# Patient Record
Sex: Female | Born: 1995 | Race: White | Hispanic: No | Marital: Single | Smoking: Never smoker
Health system: Southern US, Community
[De-identification: ages and names within clinical notes are randomized; demographics above are authoritative.]

## PROBLEM LIST (undated history)

## (undated) ENCOUNTER — Inpatient Hospital Stay: Payer: Self-pay

## (undated) DIAGNOSIS — Z62819 Personal history of unspecified abuse in childhood: Secondary | ICD-10-CM

## (undated) DIAGNOSIS — A6000 Herpesviral infection of urogenital system, unspecified: Secondary | ICD-10-CM

## (undated) DIAGNOSIS — Z789 Other specified health status: Secondary | ICD-10-CM

## (undated) DIAGNOSIS — O4693 Antepartum hemorrhage, unspecified, third trimester: Secondary | ICD-10-CM

## (undated) DIAGNOSIS — J189 Pneumonia, unspecified organism: Secondary | ICD-10-CM

## (undated) HISTORY — PX: FRACTURE SURGERY: SHX138

---
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
## 2014-02-25 ENCOUNTER — Emergency Department (INDEPENDENT_AMBULATORY_CARE_PROVIDER_SITE_OTHER)
Admission: EM | Admit: 2014-02-25 | Discharge: 2014-02-25 | Disposition: A | Discharge status: Home / Self Care | Payer: Medicaid Other | Source: Home / Self Care

## 2014-02-25 ENCOUNTER — Encounter (HOSPITAL_COMMUNITY): Payer: Self-pay | Admitting: Emergency Medicine

## 2014-02-25 ENCOUNTER — Other Ambulatory Visit (HOSPITAL_COMMUNITY)
Admission: RE | Admit: 2014-02-25 | Discharge: 2014-02-25 | Disposition: A | Discharge status: Home / Self Care | Payer: Self-pay | Source: Ambulatory Visit | Attending: Emergency Medicine | Admitting: Emergency Medicine

## 2014-02-25 DIAGNOSIS — N949 Unspecified condition associated with female genital organs and menstrual cycle: Secondary | ICD-10-CM

## 2014-02-25 DIAGNOSIS — N760 Acute vaginitis: Secondary | ICD-10-CM | POA: Insufficient documentation

## 2014-02-25 DIAGNOSIS — N730 Acute parametritis and pelvic cellulitis: Secondary | ICD-10-CM

## 2014-02-25 DIAGNOSIS — N898 Other specified noninflammatory disorders of vagina: Secondary | ICD-10-CM | POA: Diagnosis not present

## 2014-02-25 DIAGNOSIS — Z113 Encounter for screening for infections with a predominantly sexual mode of transmission: Secondary | ICD-10-CM | POA: Insufficient documentation

## 2014-02-25 DIAGNOSIS — N72 Inflammatory disease of cervix uteri: Secondary | ICD-10-CM

## 2014-02-25 DIAGNOSIS — R102 Pelvic and perineal pain: Secondary | ICD-10-CM

## 2014-02-25 LAB — POCT PREGNANCY, URINE: Preg Test, Ur: NEGATIVE

## 2014-02-25 LAB — POCT URINALYSIS DIP (DEVICE)
Bilirubin Urine: NEGATIVE
GLUCOSE, UA: NEGATIVE mg/dL
Hgb urine dipstick: NEGATIVE
KETONES UR: NEGATIVE mg/dL
Leukocytes, UA: NEGATIVE
Nitrite: NEGATIVE
PROTEIN: NEGATIVE mg/dL
SPECIFIC GRAVITY, URINE: 1.025 (ref 1.005–1.030)
Urobilinogen, UA: 0.2 mg/dL (ref 0.0–1.0)
pH: 7 (ref 5.0–8.0)

## 2014-02-25 LAB — CERVICOVAGINAL ANCILLARY ONLY
WET PREP (BD AFFIRM): NEGATIVE
WET PREP (BD AFFIRM): POSITIVE — AB
Wet Prep (BD Affirm): NEGATIVE

## 2014-02-25 MED ORDER — LIDOCAINE HCL (PF) 1 % IJ SOLN
INTRAMUSCULAR | Status: AC
  Filled 2014-02-25: qty 5

## 2014-02-25 MED ORDER — METRONIDAZOLE 500 MG PO TABS: 500.0000 mg | ORAL_TABLET | Freq: Two times a day (BID) | ORAL | Status: DC

## 2014-02-25 MED ORDER — CEFTRIAXONE SODIUM 250 MG IJ SOLR
INTRAMUSCULAR | Status: AC
  Filled 2014-02-25: qty 250

## 2014-02-25 MED ORDER — AZITHROMYCIN 250 MG PO TABS
1000.0000 mg | ORAL_TABLET | Freq: Every day | ORAL | Status: DC
  Administered 2014-02-25: 1000 mg via ORAL

## 2014-02-25 MED ORDER — CEFTRIAXONE SODIUM 250 MG IJ SOLR
250.0000 mg | Freq: Once | INTRAMUSCULAR | Status: AC
  Administered 2014-02-25: 250 mg via INTRAMUSCULAR

## 2014-02-25 MED ORDER — AZITHROMYCIN 250 MG PO TABS
ORAL_TABLET | ORAL | Status: AC
  Filled 2014-02-25: qty 1

## 2014-02-25 NOTE — Discharge Instructions (Signed)
Read all instructions Pelvic Inflammatory Disease Pelvic inflammatory disease (PID) refers to an infection in some or all of the female organs. The infection can be in the uterus, ovaries, fallopian tubes, or the surrounding tissues in the pelvis. PID can cause abdominal or pelvic pain that comes on suddenly (acute pelvic pain). PID is a serious infection because it can lead to lasting (chronic) pelvic pain or the inability to have children (infertile).  CAUSES  The infection is often caused by the normal bacteria found in the vaginal tissues. PID may also be caused by an infection that is spread during sexual contact. PID can also occur following:   The birth of a baby.   A miscarriage.   An abortion.   Major pelvic surgery.   The use of an intrauterine device (IUD).   A sexual assault.  RISK FACTORS Certain factors can put a person at higher risk for PID, such as:  Being younger than 25 years.  Being sexually active at [place] age.  Usingnonbarrier contraception.  Havingmultiple sexual partners.  Having sex with someone who has symptoms of a genital infection.  Using oral contraception. Other times, certain behaviors can increase the possibility of getting PID, such as:  Having sex during your period.  Using a vaginal douche.  Having an intrauterine device (IUD) in place. SYMPTOMS   Abdominal or pelvic pain.   Fever.   Chills.   Abnormal vaginal discharge.  Abnormal uterine bleeding.   Unusual pain shortly after finishing your period. DIAGNOSIS  Your caregiver will choose some of the following methods to make a diagnosis, such as:   Performinga physical exam and history. A pelvic exam typically reveals a very tender uterus and surrounding pelvis.   Ordering laboratory tests including a pregnancy test, blood tests, and urine test.  Orderingcultures of the vagina and cervix to check for a sexually transmitted infection (STI).  Performing an  ultrasound.   Performing a laparoscopic procedure to look inside the pelvis.  TREATMENT   Antibiotic medicines may be prescribed and taken by mouth.   Sexual partners may be treated when the infection is caused by a sexually transmitted disease (STD).   Hospitalization may be needed to give antibiotics intravenously.  Surgery may be needed, but this is rare. It may take weeks until you are completely well. If you are diagnosed with PID, you should also be checked for human immunodeficiency virus (HIV). HOME CARE INSTRUCTIONS   If given, take your antibiotics as directed. Finish the medicine even if you start to feel better.   Only take over-the-counter or prescription medicines for pain, discomfort, or fever as directed by your caregiver.   Do not have sexual intercourse until treatment is completed or as directed by your caregiver. If PID is confirmed, your recent sexual partner(s) will need treatment.   Keep your follow-up appointments. SEEK MEDICAL CARE IF:   You have increased or abnormal vaginal discharge.   You need prescription medicine for your pain.   You vomit.   You cannot take your medicines.   Your partner has an STD.  SEEK IMMEDIATE MEDICAL CARE IF:   You have a fever.   You have increased abdominal or pelvic pain.   You have chills.   You have pain when you urinate.   You are not better after 72 hours following treatment.  MAKE SURE YOU:   Understand these instructions.  Will watch your condition.  Will get help right away if you are not doing well  or get worse. Document Released: 11/04/2005 Document Revised: 03/01/2013 Document Reviewed: 10/31/2011 [facility] Patient Information 2014 [place], [place].  Cervicitis Cervicitis is a soreness and swelling (inflammation) of the cervix. Your cervix is located at the bottom of your uterus. It opens up to the vagina. CAUSES   Sexually transmitted infections (STIs).   Allergic  reaction.   Medicines or birth control devices that are put in the vagina.   Injury to the cervix.   Bacterial infections.  RISK FACTORS You are at greater risk if you:  Have unprotected sexual intercourse.  Have sexual intercourse with many partners.  Began sexual intercourse at an early age.  Have a history of STIs. SYMPTOMS  There may be no symptoms. If symptoms occur, they may include:   Grey, white, yellow, or bad-smelling vaginal discharge.   Pain or itching of the area outside the vagina.   Painful sexual intercourse.   Lower abdominal or lower back pain, especially during intercourse.   Frequent urination.   Abnormal vaginal bleeding between periods, after sexual intercourse, or after menopause.   Pressure or a heavy feeling in the pelvis.  DIAGNOSIS  Diagnosis is made after a pelvic exam. Other tests may include:   Examination of any discharge under a microscope (wet prep).   A Pap test.  TREATMENT  Treatment will depend on the cause of cervicitis. If it is caused by an STI, both you and your partner will need to be treated. Antibiotic medicines will be given.  HOME CARE INSTRUCTIONS   Do not have sexual intercourse until your health care provider says it is okay.   Do not have sexual intercourse until your partner has been treated, if your cervicitis is caused by an STI.   Take your antibiotics as directed. Finish them even if you start to feel better.  SEEK MEDICAL CARE IF:  Your symptoms come back.   You have a fever.  MAKE SURE YOU:   Understand these instructions.  Will watch your condition.  Will get help right away if you are not doing well or get worse. Document Released: 11/04/2005 Document Revised: 07/07/2013 Document Reviewed: 04/28/2013 [facility] Patient Information 2014 [place].  Sexually Transmitted Disease A sexually transmitted disease (STD) is a disease or infection that may be passed (transmitted) from  person to person, usually during sexual activity. This may happen by way of saliva, semen, blood, vaginal mucus, or urine. Common STDs include:   Gonorrhea.   Chlamydia.   Syphilis.   HIV and AIDS.   Genital herpes.   Hepatitis B and C.   Trichomonas.   Human papillomavirus (HPV).   Pubic lice.   Scabies.  Mites.  Bacterial vaginosis. WHAT ARE CAUSES OF STDs? An STD may be caused by bacteria, a virus, or parasites. STDs are often transmitted during sexual activity if one person is infected. However, they may also be transmitted through nonsexual means. STDs may be transmitted after:   Sexual intercourse with an infected person.   Sharing sex toys with an infected person.   Sharing needles with an infected person or using unclean piercing or tattoo needles.  Having intimate contact with the genitals, mouth, or rectal areas of an infected person.   Exposure to infected fluids during birth. WHAT ARE THE SIGNS AND SYMPTOMS OF STDs? Different STDs have different symptoms. Some people may not have any symptoms. If symptoms are present, they may include:   Painful or bloody urination.   Pain in the pelvis, abdomen, vagina, anus,  throat, or eyes.   Skin rash, itching, irritation, growths, sores (lesions), ulcerations, or warts in the genital or anal area.  Abnormal vaginal discharge with or without bad odor.   Penile discharge in men.   Fever.   Pain or bleeding during sexual intercourse.   Swollen glands in the groin area.   Yellow skin and eyes (jaundice). This is seen with hepatitis.   Swollen testicles.  Infertility.  Sores and blisters in the mouth. HOW ARE STDs DIAGNOSED? To make a diagnosis, your health care provider may:   Take a medical history.   Perform a physical exam.   Take a sample of any discharge for examination.  Swab the throat, cervix, opening to the penis, rectum, or vagina for testing.  Test a sample of your  first morning urine.   Perform blood tests.   Perform a Pap smear, if this applies.   Perform a colposcopy.   Perform a laparoscopy.  HOW ARE STDs TREATED? Treatment depends on the STD. Some STDs may be treated but not cured.   Chlamydia, gonorrhea, trichomonas, and syphilis can be cured with antibiotics.   Genital herpes, hepatitis, and HIV can be treated, but not cured, with prescribed medicines. The medicines lessen symptoms.   Genital warts from HPV can be treated with medicine or by freezing, burning (electrocautery), or surgery. Warts may come back.   HPV cannot be cured with medicine or surgery. However, abnormal areas may be removed from the cervix, vagina, or vulva.   If your diagnosis is confirmed, your recent sexual partners need treatment. This is true even if they are symptom-free or have a negative culture or evaluation. They should not have sex until their health care providers say it is OK. HOW CAN I REDUCE MY RISK OF GETTING AN STD?  Use latex condoms, dental dams, and water-soluble lubricants during sexual activity. Do not use petroleum jelly or oils.  Get vaccinated for HPV and hepatitis. If you have not received these vaccines in the past, talk to your health care provider about whether one or both might be right for you.   Avoid risky sex practices that can break the skin.  WHAT SHOULD I DO IF I THINK I HAVE AN STD?  See your health care provider.   Inform all sexual partners. They should be tested and treated for any STDs.  Do not have sex until your health care provider says it is OK. WHEN SHOULD I GET HELP? Seek immediate medical care if:  You develop severe abdominal pain.  You are a man and notice swelling or pain in the testicles.  You are a woman and notice swelling or pain in your vagina. Document Released: 01/25/2003 Document Revised: 08/25/2013 Document Reviewed: 05/25/2013 [facility] Patient Information 2014 [place], [place].

## 2014-02-25 NOTE — ED Notes (Signed)
Reports generalized abdominal pain, burning with urination.  Reports vaginal discharge, but says it is normal for her.

## 2014-02-25 NOTE — ED Provider Notes (Signed)
CSN: [id number]     Arrival date & time 02/25/14  1023 History   First MD Initiated Contact with Patient 02/25/14 1220     Chief Complaint  Patient presents with  . Abdominal Pain   (Consider location/radiation/quality/duration/timing/severity/associated sxs/prior Treatment) HPI Comments: 18 year old female complaining of "stomach pain" for 2 days. Pain is located bilaterally primarily in the left and right pelvis as well as across the midabdomen. Pain is worse with sitting. She also has a vaginal discharge but states this is chronic and not abnormal for her. Complains of dysuria for 2 days. Denies constitutional symptoms.   History reviewed. No pertinent past medical history. History reviewed. No pertinent past surgical history. No family history on file. History  Substance Use Topics  . Smoking status: Never Smoker   . Smokeless tobacco: Not on file  . Alcohol Use: No   OB History   Grav Para Term Preterm Abortions TAB SAB Ect Mult Living                 Review of Systems  Constitutional: Negative.   HENT: Negative.   Respiratory: Negative.   Cardiovascular: Negative.   Gastrointestinal: Positive for nausea and abdominal pain. Negative for vomiting and abdominal distention.  Genitourinary: Positive for dysuria, vaginal discharge and pelvic pain. Negative for urgency, frequency, flank pain and vaginal bleeding.  Neurological: Negative for seizures and headaches.    Allergies  Review of patient's allergies indicates no known allergies.  Home Medications   Current Outpatient Rx  Name  Route  Sig  Dispense  Refill  . metroNIDAZOLE (FLAGYL) 500 MG tablet   Oral   Take 1 tablet (500 mg total) by mouth 2 (two) times daily. X 7 days   14 tablet   0    BP 107/68  Pulse 66  Temp(Src) 98.1 F (36.7 C) (Oral)  Resp 18  SpO2 100%  LMP 01/19/2014 Physical Exam  Nursing note and vitals reviewed. Constitutional: She is oriented to person, place, and time. She appears  well-developed and well-nourished. No distress.  Eyes: Conjunctivae and EOM are normal.  Neck: Normal range of motion. Neck supple.  Cardiovascular: Normal rate, regular rhythm and normal heart sounds.   No murmur heard. Pulmonary/Chest: Effort normal and breath sounds normal. No respiratory distress. She has no wheezes.  Abdominal: Soft. Bowel sounds are normal. She exhibits no mass. There is tenderness. There is guarding. There is no rebound.  Greatest tenderness in the bilateral pelvis, Lesser to mid abdomen.  Genitourinary: Vaginal discharge found.  NEFG Moderate amt White creamy vag D/C. Cx midline far posterior. Ectocx erythematous and tender. No FB seen. +CMT and Bilat adnexal tenderness..moderate to severe.  Musculoskeletal: She exhibits no edema and no tenderness.  Neurological: She is alert and oriented to person, place, and time.  Skin: Skin is warm and dry.  Psychiatric: She has a normal mood and affect.    ED Course  Procedures (including critical care time) Labs Review Labs Reviewed  POCT URINALYSIS DIP (DEVICE)  POCT PREGNANCY, URINE  CERVICOVAGINAL ANCILLARY ONLY   Imaging Review No results found. Results for orders placed during the hospital encounter of 02/25/14  POCT URINALYSIS DIP (DEVICE)      Result Value Ref Range   Glucose, UA NEGATIVE  NEGATIVE mg/dL   Bilirubin Urine NEGATIVE  NEGATIVE   Ketones, ur NEGATIVE  NEGATIVE mg/dL   Specific Gravity, Urine 1.025  1.005 - 1.030   Hgb urine dipstick NEGATIVE  NEGATIVE   pH 7.0  5.0 - 8.0   Protein, ur NEGATIVE  NEGATIVE mg/dL   Urobilinogen, UA 0.2  0.0 - 1.0 mg/dL   Nitrite NEGATIVE  NEGATIVE   Leukocytes, UA NEGATIVE  NEGATIVE  POCT PREGNANCY, URINE      Result Value Ref Range   Preg Test, Ur NEGATIVE  NEGATIVE     MDM   1. PID (acute pelvic inflammatory disease)   2. Cervicitis   3. Vaginal discharge   4. Pelvic pain    cerv cytology pending Rocephin 250 mg IM Azithromycin 1 gm po Rx flagyl  500 bid       Janne Napoleon, NP 02/25/14 1256

## 2014-02-25 NOTE — ED Provider Notes (Signed)
Medical screening examination/treatment/procedure(s) were performed by resident physician or non-physician practitioner and as supervising physician I was immediately available for consultation/collaboration.   Breyah Akhter DOUGLAS MD.   Bejamin Hackbart D Emalynn Clewis, MD 02/25/14 1258 

## 2014-02-28 LAB — CERVICOVAGINAL ANCILLARY ONLY
CHLAMYDIA, DNA PROBE: NEGATIVE
Neisseria Gonorrhea: NEGATIVE

## 2014-03-02 ENCOUNTER — Telehealth (HOSPITAL_COMMUNITY): Payer: Self-pay | Admitting: *Deleted

## 2014-03-02 MED ORDER — FLUCONAZOLE 150 MG PO TABS: ORAL_TABLET | ORAL | Status: DC

## 2014-03-02 NOTE — ED Notes (Signed)
GC/Chlamydia neg., Affirm: Candida pos., Gardnerella and Trich neg.  Message sent to Janne Napoleon NP.  He e-prescribed Diflucan to pt.'s pharmacy [place] and also printed.  I called pt. and left a message to call.  Call 1. Hanley [facility] 03/02/2014

## 2014-04-08 ENCOUNTER — Emergency Department (HOSPITAL_COMMUNITY)
Admission: EM | Admit: 2014-04-08 | Discharge: 2014-04-08 | Disposition: A | Discharge status: Home / Self Care | Payer: Self-pay | Attending: Emergency Medicine | Admitting: Emergency Medicine

## 2014-04-08 ENCOUNTER — Emergency Department (HOSPITAL_COMMUNITY): Payer: Self-pay

## 2014-04-08 ENCOUNTER — Encounter (HOSPITAL_COMMUNITY): Payer: Self-pay | Admitting: Emergency Medicine

## 2014-04-08 DIAGNOSIS — Z79899 Other long term (current) drug therapy: Secondary | ICD-10-CM | POA: Insufficient documentation

## 2014-04-08 DIAGNOSIS — Y929 Unspecified place or not applicable: Secondary | ICD-10-CM | POA: Insufficient documentation

## 2014-04-08 DIAGNOSIS — Y9389 Activity, other specified: Secondary | ICD-10-CM | POA: Insufficient documentation

## 2014-04-08 DIAGNOSIS — S93409A Sprain of unspecified ligament of unspecified ankle, initial encounter: Secondary | ICD-10-CM | POA: Insufficient documentation

## 2014-04-08 DIAGNOSIS — IMO0002 Reserved for concepts with insufficient information to code with codable children: Secondary | ICD-10-CM | POA: Insufficient documentation

## 2014-04-08 MED ORDER — HYDROCODONE-ACETAMINOPHEN 5-325 MG PO TABS
1.0000 | ORAL_TABLET | Freq: Once | ORAL | Status: AC
  Administered 2014-04-08: 1 via ORAL
  Filled 2014-04-08: qty 1

## 2014-04-08 MED ORDER — IBUPROFEN 800 MG PO TABS: 800.0000 mg | ORAL_TABLET | Freq: Three times a day (TID) | ORAL | Status: DC

## 2014-04-08 NOTE — ED Notes (Signed)
Patient transported to X-ray 

## 2014-04-08 NOTE — ED Notes (Signed)
Pt refused ice pack at this time. States "it hurts too much."

## 2014-04-08 NOTE — ED Provider Notes (Signed)
Medical screening examination/treatment/procedure(s) were performed by non-physician practitioner and as supervising physician I was immediately available for consultation/collaboration.   Leota Jacobsen, MD 04/08/14 2234

## 2014-04-08 NOTE — ED Provider Notes (Signed)
CSN: [id number]     Arrival date & time 04/08/14  2031 History  This chart was scribed for non-physician practitioner, Hazel Sams, PA-C working with Leota Jacobsen, MD by Einar Pheasant, ED scribe. This patient was seen in room WTR8/WTR8 and the patient's care was started at 8:42 PM.    Chief Complaint  Patient presents with  . Ankle Injury   The history is provided by the patient. No language interpreter was used.   HPI Comments: Jeanette Wright is a 18 y.o. female who presents to the Emergency Department complaining of worsening right ankle pain that started approximately 1 hour ago. Pt states that she was playing around with her friend when she accidenttaly kicked him in the shin. She immediately started to experience "throbbing" pain. Her right foot is currently wrapped with an ACE wrap. She denies any fever, chills, nausea, emesis, or SOB.   No past medical history on file. No past surgical history on file. No family history on file. History  Substance Use Topics  . Smoking status: Never Smoker   . Smokeless tobacco: Not on file  . Alcohol Use: No   OB History   Grav Para Term Preterm Abortions TAB SAB Ect Mult Living                 Review of Systems  Constitutional: Negative for fever and chills.  HENT: Negative for congestion, rhinorrhea and sore throat.   Respiratory: Negative for cough and shortness of breath.   Cardiovascular: Negative for chest pain.  Gastrointestinal: Negative for nausea, vomiting, abdominal pain, diarrhea and constipation.  Musculoskeletal: Positive for arthralgias (R ankle).  Skin: Negative for rash and wound.  Neurological: Negative for syncope, numbness and headaches.  All other systems reviewed and are negative.  Allergies  Review of patient's allergies indicates no known allergies.  Home Medications   Prior to Admission medications   Medication Sig Start Date End Date Taking? Authorizing Provider  fluconazole (DIFLUCAN) 150 MG  tablet 1 tab po x 1. May repeat in 72 hours if no improvement 03/02/14   Janne Napoleon, NP  metroNIDAZOLE (FLAGYL) 500 MG tablet Take 1 tablet (500 mg total) by mouth 2 (two) times daily. X 7 days 02/25/14   Janne Napoleon, NP   Triage Vitals: BP 112/50  Pulse 59  Temp(Src) 98.8 F (37.1 C) (Oral)  Resp 16  SpO2 100%  LMP 04/08/2014  Physical Exam  Nursing note and vitals reviewed. Constitutional: She appears well-developed and well-nourished. No distress.  Temp: 98.7  BP:101/62 HR:83  HENT:  Head: Normocephalic and atraumatic.  Eyes: Conjunctivae are normal. Right eye exhibits no discharge. Left eye exhibits no discharge.  Neck: Neck supple.  Cardiovascular: Normal rate, regular rhythm and normal heart sounds.  Exam reveals no gallop and no friction rub.   No murmur heard. Good dorsal pedal pulse. Good capillary refill.  Pulmonary/Chest: Effort normal and breath sounds normal. No respiratory distress.  Abdominal: Soft. She exhibits no distension. There is no tenderness.  Musculoskeletal: She exhibits tenderness. She exhibits no edema.  There is tenderness around the right ankle with reduced range of motion secondary to pain. There is prominence of the talus bone but this is symmetrical bilaterally. Normal dorsal pedal pulses. No deformity of the lateral or medial malleolus. Normal distal sensation to the toes and capillary refill.  Neurological: She is alert.  Skin: Skin is warm and dry.  No discoloration noted.   Psychiatric: She has a normal mood and affect.  Her behavior is normal. Thought content normal.    ED Course  Procedures   DIAGNOSTIC STUDIES: Oxygen Saturation is 100% on RA, normal by my interpretation.    COORDINATION OF CARE: 8:49 PM- Pt advised of plan for treatment and pt agrees.  X-rays reviewed. No signs of fracture or dislocation. Suspect mild sprain. Patient instructed to use rest, ice, compression elevation.    Imaging Review Dg Ankle Complete  Right  04/08/2014   CLINICAL DATA:  Injury.  Pain.  EXAM: RIGHT ANKLE - COMPLETE 3+ VIEW  COMPARISON:  None.  FINDINGS: There is no evidence of fracture, dislocation, or joint effusion. There is no evidence of arthropathy or other focal bone abnormality. Soft tissues are unremarkable.  IMPRESSION: Negative.   Electronically Signed   By: Lajean Manes M.D.   On: 04/08/2014 21:12    MDM   Final diagnoses:  Ankle sprain    I personally performed the services described in this documentation, which was scribed in my presence. The recorded information has been reviewed and is accurate.   Martie Lee, PA-C 04/08/14 2131

## 2014-04-08 NOTE — ED Notes (Signed)
Pt states she kicked her friend in the shin and injured her R ankle. Pt has swelling and bruising to R ankle. Pt states she has been unable to bear wt on ankle. Pt arrives to exam room in wheelchair. Family with pt.

## 2014-04-08 NOTE — Discharge Instructions (Signed)
Your x-rays of your ankle today did not show any broken bones or dislocation. At this time your providers feel you have sprained her ankle. Use rest, ice, compression and elevation to reduce pain and swelling. Followup with your primary doctor for continued evaluation and treatment.    Ankle Sprain An ankle sprain is an injury to the strong, fibrous tissues (ligaments) that hold the bones of your ankle joint together.  CAUSES An ankle sprain is usually caused by a fall or by twisting your ankle. Ankle sprains most commonly occur when you step on the outer edge of your foot, and your ankle turns inward. People who participate in sports are more prone to these types of injuries.  SYMPTOMS   Pain in your ankle. The pain may be present at rest or only when you are trying to stand or walk.  Swelling.  Bruising. Bruising may develop immediately or within 1 to 2 days after your injury.  Difficulty standing or walking, particularly when turning corners or changing directions. DIAGNOSIS  Your caregiver will ask you details about your injury and perform a physical exam of your ankle to determine if you have an ankle sprain. During the physical exam, your caregiver will press on and apply pressure to specific areas of your foot and ankle. Your caregiver will try to move your ankle in certain ways. An X-ray exam may be done to be sure a bone was not broken or a ligament did not separate from one of the bones in your ankle (avulsion fracture).  TREATMENT  Certain types of braces can help stabilize your ankle. Your caregiver can make a recommendation for this. Your caregiver may recommend the use of medicine for pain. If your sprain is severe, your caregiver may refer you to a surgeon who helps to restore function to parts of your skeletal system (orthopedist) or a physical therapist. [place] ice to your injury for 1 2 days or as directed by your caregiver. Applying ice helps to  reduce inflammation and pain.  Put ice in a plastic bag.  Place a towel between your skin and the bag.  Leave the ice on for 15-20 minutes at a time, every 2 hours while you are awake.  Only take over-the-counter or prescription medicines for pain, discomfort, or fever as directed by your caregiver.  Elevate your injured ankle above the level of your heart as much as possible for 2 3 days.  If your caregiver recommends crutches, use them as instructed. Gradually put weight on the affected ankle. Continue to use crutches or a cane until you can walk without feeling pain in your ankle.  If you have a plaster splint, wear the splint as directed by your caregiver. Do not rest it on anything harder than a pillow for the first 24 hours. Do not put weight on it. Do not get it wet. You may take it off to take a shower or bath.  You may have been given an elastic bandage to wear around your ankle to provide support. If the elastic bandage is too tight (you have numbness or tingling in your foot or your foot becomes cold and blue), adjust the bandage to make it comfortable.  If you have an air splint, you may blow more air into it or let air out to make it more comfortable. You may take your splint off at night and before taking a shower or bath. Wiggle your toes in the splint several times  per day to decrease swelling. SEEK MEDICAL CARE IF:   You have rapidly increasing bruising or swelling.  Your toes feel extremely cold or you lose feeling in your foot.  Your pain is not relieved with medicine. SEEK IMMEDIATE MEDICAL CARE IF:  Your toes are numb or blue.  You have severe pain that is increasing. MAKE SURE YOU:   Understand these instructions.  Will watch your condition.  Will get help right away if you are not doing well or get worse. Document Released: 11/04/2005 Document Revised: 07/29/2012 Document Reviewed: 11/16/2011 [facility] Patient Information 2014 [place], [place].

## 2014-04-08 NOTE — ED Notes (Signed)
Pt has a ride home.  

## 2014-06-30 LAB — OB RESULTS CONSOLE HEPATITIS B SURFACE ANTIGEN: Hepatitis B Surface Ag: NEGATIVE

## 2014-06-30 LAB — OB RESULTS CONSOLE RUBELLA ANTIBODY, IGM: Rubella: IMMUNE

## 2014-06-30 LAB — OB RESULTS CONSOLE HIV ANTIBODY (ROUTINE TESTING): HIV: NONREACTIVE

## 2014-06-30 LAB — OB RESULTS CONSOLE GC/CHLAMYDIA
CHLAMYDIA, DNA PROBE: NEGATIVE
Gonorrhea: NEGATIVE

## 2014-06-30 LAB — OB RESULTS CONSOLE RPR: RPR: NONREACTIVE

## 2014-10-27 ENCOUNTER — Observation Stay: Payer: Self-pay | Admitting: Obstetrics and Gynecology

## 2014-11-18 NOTE — L&D Delivery Note (Addendum)
Delivery Note At 5:54 PM a viable and healthy female was delivered via  (Presentation: OA; ROT).  APGAR: 9,9 ; weight  P.   Placenta status: delivered, intact.  Cord: 3VC with the following complications: none .    Anesthesia:  epidural Episiotomy:  none Lacerations:  Hemostatic B periurethral abrasions Suture Repair: N/A Est. Blood Loss (mL): 400cc   Mom to postpartum.  Baby to Couplet care / Skin to Skin.  Bovard-Stuckert, Jeanette Wright 01/11/2015, 6:13 PM  Bo/A+/Contra Mirena/RI/Tdap in [facility]  D/W pt and FOB circumcision for female infant, including r/b/a wish to proceed.     [name]

## 2014-12-16 LAB — OB RESULTS CONSOLE GBS: STREP GROUP B AG: NEGATIVE

## 2015-01-02 ENCOUNTER — Observation Stay (HOSPITAL_COMMUNITY)
Admission: EM | Admit: 2015-01-02 | Discharge: 2015-01-03 | Disposition: A | Discharge status: Home / Self Care | Payer: Medicaid Other | Attending: Obstetrics and Gynecology | Admitting: Obstetrics and Gynecology

## 2015-01-02 ENCOUNTER — Encounter (HOSPITAL_COMMUNITY): Payer: Self-pay | Admitting: *Deleted

## 2015-01-02 DIAGNOSIS — S0990XA Unspecified injury of head, initial encounter: Principal | ICD-10-CM | POA: Insufficient documentation

## 2015-01-02 DIAGNOSIS — Y92410 Unspecified street and highway as the place of occurrence of the external cause: Secondary | ICD-10-CM | POA: Insufficient documentation

## 2015-01-02 DIAGNOSIS — Z3A38 38 weeks gestation of pregnancy: Secondary | ICD-10-CM | POA: Diagnosis not present

## 2015-01-02 DIAGNOSIS — Y939 Activity, unspecified: Secondary | ICD-10-CM | POA: Insufficient documentation

## 2015-01-02 DIAGNOSIS — Y999 Unspecified external cause status: Secondary | ICD-10-CM | POA: Insufficient documentation

## 2015-01-02 DIAGNOSIS — O9A213 Injury, poisoning and certain other consequences of external causes complicating pregnancy, third trimester: Secondary | ICD-10-CM

## 2015-01-02 HISTORY — DX: Pneumonia, unspecified organism: J18.9

## 2015-01-02 HISTORY — DX: Other specified health status: Z78.9

## 2015-01-02 HISTORY — DX: Injury, poisoning and certain other consequences of external causes complicating pregnancy, third trimester: O9A.213

## 2015-01-02 LAB — CBC
HCT: 29 % — ABNORMAL LOW (ref 36.0–46.0)
Hemoglobin: 9.2 g/dL — ABNORMAL LOW (ref 12.0–15.0)
MCH: 26.8 pg (ref 26.0–34.0)
MCHC: 31.7 g/dL (ref 30.0–36.0)
MCV: 84.5 fL (ref 78.0–100.0)
Platelets: 203 10*3/uL (ref 150–400)
RBC: 3.43 MIL/uL — AB (ref 3.87–5.11)
RDW: 13.5 % (ref 11.5–15.5)
WBC: 14.8 10*3/uL — ABNORMAL HIGH (ref 4.0–10.5)

## 2015-01-02 LAB — TYPE AND SCREEN
ABO/RH(D): A POS
ANTIBODY SCREEN: NEGATIVE

## 2015-01-02 MED ORDER — ACETAMINOPHEN 325 MG PO TABS
650.0000 mg | ORAL_TABLET | Freq: Once | ORAL | Status: AC
  Administered 2015-01-02: 650 mg via ORAL
  Filled 2015-01-02: qty 2

## 2015-01-02 MED ORDER — ZOLPIDEM TARTRATE 5 MG PO TABS: 5.0000 mg | ORAL_TABLET | Freq: Every evening | ORAL | Status: DC | PRN

## 2015-01-02 MED ORDER — DOCUSATE SODIUM 100 MG PO CAPS
100.0000 mg | ORAL_CAPSULE | Freq: Every day | ORAL | Status: DC
Start: 2015-01-02 — End: 2015-01-03
  Filled 2015-01-02: qty 1

## 2015-01-02 MED ORDER — ACETAMINOPHEN 325 MG PO TABS
650.0000 mg | ORAL_TABLET | ORAL | Status: DC | PRN
  Administered 2015-01-03 (×2): 650 mg via ORAL
  Filled 2015-01-02 (×2): qty 2

## 2015-01-02 MED ORDER — BUTORPHANOL TARTRATE 1 MG/ML IJ SOLN
INTRAMUSCULAR | Status: AC
  Administered 2015-01-02: 1 mg via INTRAVENOUS
  Filled 2015-01-02: qty 1

## 2015-01-02 MED ORDER — CALCIUM CARBONATE ANTACID 500 MG PO CHEW: 2.0000 | CHEWABLE_TABLET | ORAL | Status: DC | PRN

## 2015-01-02 MED ORDER — PRENATAL MULTIVITAMIN CH
1.0000 | ORAL_TABLET | Freq: Every day | ORAL | Status: DC
  Administered 2015-01-03: 1 via ORAL
  Filled 2015-01-02: qty 1

## 2015-01-02 MED ORDER — BUTORPHANOL TARTRATE 1 MG/ML IJ SOLN
1.0000 mg | Freq: Once | INTRAMUSCULAR | Status: AC
  Administered 2015-01-02: 1 mg via INTRAVENOUS

## 2015-01-02 NOTE — ED Notes (Addendum)
[facility] reports pt is contracting, will be transferred to [facility] for obs. Going to antenatal #152. Dr Luanna Cole is accepting MD.
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
## 2015-01-02 NOTE — Progress Notes (Signed)
Patient removed from monitor for transfer to room 160 for staffing needs.  However, notified to leave patient in 155 for now unless laboring.

## 2015-01-02 NOTE — Progress Notes (Signed)
This note also relates to the following rows which could not be included: Pulse Rate - Cannot attach notes to unvalidated device data Resp - Cannot attach notes to unvalidated device data SpO2 - Cannot attach notes to unvalidated device data   EFM off for transfer to antenatal via [facility].

## 2015-01-03 LAB — ABO/RH: ABO/RH(D): A POS

## 2015-01-03 MED ORDER — OXYCODONE-ACETAMINOPHEN 5-325 MG PO TABS
1.0000 | ORAL_TABLET | Freq: Once | ORAL | Status: DC
  Filled 2015-01-03: qty 1
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
## 2015-01-03 NOTE — Progress Notes (Signed)
Discharge instructions reviewed with patient. Patient verbalizes understanding and all questions answered. Belongings gathered and with patient and significant other.

## 2015-01-03 NOTE — Progress Notes (Signed)
Marvel Plan, MD, notified of patients complaint of persistent headache and nausea a with little relief from tylenol. Pt wondering if she has a concussion and wanted to know if she needed any kind of scan. MD updated on assessment of patient. Orders received to give 1 perocet at this time.
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
## 2015-01-03 NOTE — Progress Notes (Signed)
Patient ID: Jeanette Wright, female   DOB: 1995/11/29, 19 y.o.   MRN: [id number] Pt doing well.  Had HA earlier but improved throughout day.  Was worried re: possible mild concussion, no focal neuro symptoms Good FM, not feeling any contractions.  Scant spotting FHR category one and no decels She feels ready for d/c home, declines pain medications Will d/c home and f/u in office on Friday 01/06/15 D/w pt kick counts

## 2015-01-04 ENCOUNTER — Inpatient Hospital Stay (HOSPITAL_COMMUNITY)
Admission: AD | Admit: 2015-01-04 | Discharge: 2015-01-04 | Disposition: A | Discharge status: Home / Self Care | Payer: Medicaid Other | Source: Ambulatory Visit | Attending: Obstetrics and Gynecology | Admitting: Obstetrics and Gynecology

## 2015-01-04 ENCOUNTER — Encounter (HOSPITAL_COMMUNITY): Payer: Self-pay

## 2015-01-04 DIAGNOSIS — O471 False labor at or after 37 completed weeks of gestation: Secondary | ICD-10-CM | POA: Insufficient documentation

## 2015-01-04 DIAGNOSIS — O26853 Spotting complicating pregnancy, third trimester: Secondary | ICD-10-CM | POA: Diagnosis not present

## 2015-01-04 DIAGNOSIS — Z3A39 39 weeks gestation of pregnancy: Secondary | ICD-10-CM | POA: Diagnosis not present

## 2015-01-04 LAB — URINALYSIS, ROUTINE W REFLEX MICROSCOPIC
Bilirubin Urine: NEGATIVE
Glucose, UA: NEGATIVE mg/dL
Ketones, ur: NEGATIVE mg/dL
Leukocytes, UA: NEGATIVE
Nitrite: NEGATIVE
Protein, ur: NEGATIVE mg/dL
Urobilinogen, UA: 0.2 mg/dL (ref 0.0–1.0)
pH: 6 (ref 5.0–8.0)

## 2015-01-04 LAB — URINE MICROSCOPIC-ADD ON
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
## 2015-01-04 NOTE — Discharge Instructions (Signed)
Braxton Hicks Contractions °Contractions of the uterus can occur throughout pregnancy. Contractions are not always a sign that you are in labor.  °WHAT ARE BRAXTON HICKS CONTRACTIONS?  °Contractions that occur before labor are called Braxton Hicks contractions, or false labor. Toward the end of pregnancy (32-34 weeks), these contractions can develop more often and may become more forceful. This is not true labor because these contractions do not result in opening (dilatation) and thinning of the cervix. They are sometimes difficult to tell apart from true labor because these contractions can be forceful and people have different pain tolerances. You should not feel embarrassed if you go to the hospital with false labor. Sometimes, the only way to tell if you are in true labor is for your health care provider to look for changes in the cervix. °If there are no prenatal problems or other health problems associated with the pregnancy, it is completely safe to be sent home with false labor and await the onset of true labor. °HOW CAN YOU TELL THE DIFFERENCE BETWEEN TRUE AND FALSE LABOR? °False Labor °· The contractions of false labor are usually shorter and not as hard as those of true labor.   °· The contractions are usually irregular.   °· The contractions are often felt in the front of the lower abdomen and in the groin.   °· The contractions may go away when you walk around or change positions while lying down.   °· The contractions get weaker and are shorter lasting as time goes on.   °· The contractions do not usually become progressively stronger, regular, and closer together as with true labor.   °True Labor °· Contractions in true labor last 30-70 seconds, become very regular, usually become more intense, and increase in frequency.   °· The contractions do not go away with walking.   °· The discomfort is usually felt in the top of the uterus and spreads to the lower abdomen and low back.   °· True labor can be  determined by your health care provider with an exam. This will show that the cervix is dilating and getting thinner.   °WHAT TO REMEMBER °· Keep up with your usual exercises and follow other instructions given by your health care provider.   °· Take medicines as directed by your health care provider.   °· Keep your regular prenatal appointments.   °· Eat and drink lightly if you think you are going into labor.   °· If Braxton Hicks contractions are making you uncomfortable:   °¨ Change your position from lying down or resting to walking, or from walking to resting.   °¨ Sit and rest in a tub of warm water.   °¨ Drink 2-3 glasses of water. Dehydration may cause these contractions.   °¨ Do slow and deep breathing several times an hour.   °WHEN SHOULD I SEEK IMMEDIATE MEDICAL CARE? °Seek immediate medical care if: °· Your contractions become stronger, more regular, and closer together.   °· You have fluid leaking or gushing from your vagina.   °· You have a fever.   °· You pass blood-tinged mucus.   °· You have vaginal bleeding.   °· You have continuous abdominal pain.   °· You have low back pain that you never had before.   °· You feel your baby's head pushing down and causing pelvic pressure.   °· Your baby is not moving as much as it used to.   °Document Released: 11/04/2005 Document Revised: 11/09/2013 Document Reviewed: 08/16/2013 °ExitCare® Patient Information ©2015 [employer]. This information is not intended to replace advice given to you by your health care   provider. Make sure you discuss any questions you have with your health care provider. ° °

## 2015-01-11 ENCOUNTER — Encounter (HOSPITAL_COMMUNITY): Payer: Self-pay | Admitting: *Deleted

## 2015-01-11 ENCOUNTER — Inpatient Hospital Stay (HOSPITAL_COMMUNITY): Payer: Medicaid Other

## 2015-01-11 ENCOUNTER — Inpatient Hospital Stay (HOSPITAL_COMMUNITY): Payer: Medicaid Other | Admitting: Anesthesiology

## 2015-01-11 ENCOUNTER — Inpatient Hospital Stay (HOSPITAL_COMMUNITY)
Admission: AD | Admit: 2015-01-11 | Discharge: 2015-01-12 | DRG: 775 | Disposition: A | Discharge status: Home / Self Care | Payer: Medicaid Other | Source: Ambulatory Visit | Attending: Obstetrics and Gynecology | Admitting: Obstetrics and Gynecology

## 2015-01-11 DIAGNOSIS — Z3403 Encounter for supervision of normal first pregnancy, third trimester: Secondary | ICD-10-CM | POA: Diagnosis present

## 2015-01-11 DIAGNOSIS — Z3A39 39 weeks gestation of pregnancy: Secondary | ICD-10-CM | POA: Diagnosis present

## 2015-01-11 DIAGNOSIS — O4693 Antepartum hemorrhage, unspecified, third trimester: Secondary | ICD-10-CM

## 2015-01-11 DIAGNOSIS — O4690 Antepartum hemorrhage, unspecified, unspecified trimester: Secondary | ICD-10-CM | POA: Insufficient documentation

## 2015-01-11 HISTORY — DX: Personal history of unspecified abuse in childhood: Z62.819

## 2015-01-11 HISTORY — DX: Antepartum hemorrhage, unspecified, third trimester: O46.93

## 2015-01-11 LAB — CBC
HCT: 29.9 % — ABNORMAL LOW (ref 36.0–46.0)
Hemoglobin: 9.6 g/dL — ABNORMAL LOW (ref 12.0–15.0)
MCH: 26.5 pg (ref 26.0–34.0)
MCHC: 32.1 g/dL (ref 30.0–36.0)
MCV: 82.6 fL (ref 78.0–100.0)
Platelets: 213 10*3/uL (ref 150–400)
RBC: 3.62 MIL/uL — AB (ref 3.87–5.11)
RDW: 13.8 % (ref 11.5–15.5)
WBC: 13.8 10*3/uL — ABNORMAL HIGH (ref 4.0–10.5)

## 2015-01-11 LAB — TYPE AND SCREEN
ABO/RH(D): A POS
Antibody Screen: NEGATIVE

## 2015-01-11 LAB — RPR: RPR: NONREACTIVE

## 2015-01-11 LAB — HIV ANTIBODY (ROUTINE TESTING W REFLEX): HIV Screen 4th Generation wRfx: NONREACTIVE

## 2015-01-11 MED ORDER — BUTORPHANOL TARTRATE 1 MG/ML IJ SOLN
1.0000 mg | Freq: Once | INTRAMUSCULAR | Status: AC
  Administered 2015-01-11: 1 mg via INTRAVENOUS
  Filled 2015-01-11: qty 1

## 2015-01-11 MED ORDER — FLEET ENEMA 7-19 GM/118ML RE ENEM: 1.0000 | ENEMA | RECTAL | Status: DC | PRN

## 2015-01-11 MED ORDER — OXYCODONE-ACETAMINOPHEN 5-325 MG PO TABS: 2.0000 | ORAL_TABLET | ORAL | Status: DC | PRN

## 2015-01-11 MED ORDER — TERBUTALINE SULFATE 1 MG/ML IJ SOLN
0.2500 mg | Freq: Once | INTRAMUSCULAR | Status: DC | PRN
  Filled 2015-01-11: qty 1

## 2015-01-11 MED ORDER — PHENYLEPHRINE 40 MCG/ML (10ML) SYRINGE FOR IV PUSH (FOR BLOOD PRESSURE SUPPORT)
80.0000 ug | PREFILLED_SYRINGE | INTRAVENOUS | Status: DC | PRN
  Filled 2015-01-11: qty 2

## 2015-01-11 MED ORDER — OXYTOCIN 40 UNITS IN LACTATED RINGERS INFUSION - SIMPLE MED
1.0000 m[IU]/min | INTRAVENOUS | Status: DC
  Administered 2015-01-11: 1 m[IU]/min via INTRAVENOUS
  Filled 2015-01-11: qty 1000

## 2015-01-11 MED ORDER — OXYTOCIN BOLUS FROM INFUSION
500.0000 mL | INTRAVENOUS | Status: DC
  Administered 2015-01-11: 500 mL via INTRAVENOUS

## 2015-01-11 MED ORDER — PRENATAL MULTIVITAMIN CH
1.0000 | ORAL_TABLET | Freq: Every day | ORAL | Status: DC
  Filled 2015-01-11: qty 1

## 2015-01-11 MED ORDER — DIPHENHYDRAMINE HCL 50 MG/ML IJ SOLN: 12.5000 mg | INTRAMUSCULAR | Status: DC | PRN

## 2015-01-11 MED ORDER — OXYTOCIN 40 UNITS IN LACTATED RINGERS INFUSION - SIMPLE MED: 62.5000 mL/h | INTRAVENOUS | Status: DC

## 2015-01-11 MED ORDER — LACTATED RINGERS IV SOLN: 500.0000 mL | INTRAVENOUS | Status: DC | PRN

## 2015-01-11 MED ORDER — ONDANSETRON HCL 4 MG/2ML IJ SOLN: 4.0000 mg | Freq: Four times a day (QID) | INTRAMUSCULAR | Status: DC | PRN

## 2015-01-11 MED ORDER — LACTATED RINGERS IV SOLN: INTRAVENOUS | Status: DC

## 2015-01-11 MED ORDER — LACTATED RINGERS IV SOLN
INTRAVENOUS | Status: DC
  Administered 2015-01-11 (×3): via INTRAVENOUS

## 2015-01-11 MED ORDER — OXYCODONE-ACETAMINOPHEN 5-325 MG PO TABS: 1.0000 | ORAL_TABLET | ORAL | Status: DC | PRN

## 2015-01-11 MED ORDER — PHENYLEPHRINE 40 MCG/ML (10ML) SYRINGE FOR IV PUSH (FOR BLOOD PRESSURE SUPPORT)
80.0000 ug | PREFILLED_SYRINGE | INTRAVENOUS | Status: DC | PRN
  Filled 2015-01-11: qty 20
  Filled 2015-01-11: qty 2

## 2015-01-11 MED ORDER — EPHEDRINE 5 MG/ML INJ
10.0000 mg | INTRAVENOUS | Status: DC | PRN
  Filled 2015-01-11: qty 2

## 2015-01-11 MED ORDER — ONDANSETRON HCL 4 MG/2ML IJ SOLN: 4.0000 mg | INTRAMUSCULAR | Status: DC | PRN

## 2015-01-11 MED ORDER — ONDANSETRON HCL 4 MG PO TABS: 4.0000 mg | ORAL_TABLET | ORAL | Status: DC | PRN

## 2015-01-11 MED ORDER — SENNOSIDES-DOCUSATE SODIUM 8.6-50 MG PO TABS
2.0000 | ORAL_TABLET | ORAL | Status: DC
  Filled 2015-01-11: qty 2

## 2015-01-11 MED ORDER — IBUPROFEN 600 MG PO TABS
600.0000 mg | ORAL_TABLET | Freq: Four times a day (QID) | ORAL | Status: DC
  Administered 2015-01-11 – 2015-01-12 (×4): 600 mg via ORAL
  Filled 2015-01-11 (×4): qty 1

## 2015-01-11 MED ORDER — CITRIC ACID-SODIUM CITRATE 334-500 MG/5ML PO SOLN: 30.0000 mL | ORAL | Status: DC | PRN

## 2015-01-11 MED ORDER — FENTANYL 2.5 MCG/ML BUPIVACAINE 1/10 % EPIDURAL INFUSION (WH - ANES)
14.0000 mL/h | INTRAMUSCULAR | Status: DC | PRN
  Administered 2015-01-11 (×2): 14 mL/h via EPIDURAL
  Filled 2015-01-11 (×2): qty 125

## 2015-01-11 MED ORDER — LIDOCAINE HCL (PF) 1 % IJ SOLN
INTRAMUSCULAR | Status: DC | PRN
  Administered 2015-01-11 (×2): 8 mL

## 2015-01-11 MED ORDER — WITCH HAZEL-GLYCERIN EX PADS: 1.0000 "application " | MEDICATED_PAD | CUTANEOUS | Status: DC | PRN

## 2015-01-11 MED ORDER — SIMETHICONE 80 MG PO CHEW: 80.0000 mg | CHEWABLE_TABLET | ORAL | Status: DC | PRN

## 2015-01-11 MED ORDER — BENZOCAINE-MENTHOL 20-0.5 % EX AERO
1.0000 "application " | INHALATION_SPRAY | CUTANEOUS | Status: DC | PRN
  Administered 2015-01-11: 1 via TOPICAL
  Filled 2015-01-11: qty 56

## 2015-01-11 MED ORDER — LANOLIN HYDROUS EX OINT: TOPICAL_OINTMENT | CUTANEOUS | Status: DC | PRN

## 2015-01-11 MED ORDER — ZOLPIDEM TARTRATE 5 MG PO TABS: 5.0000 mg | ORAL_TABLET | Freq: Every evening | ORAL | Status: DC | PRN

## 2015-01-11 MED ORDER — FENTANYL 2.5 MCG/ML BUPIVACAINE 1/10 % EPIDURAL INFUSION (WH - ANES)
INTRAMUSCULAR | Status: DC | PRN
  Administered 2015-01-11: 14 mL/h via EPIDURAL

## 2015-01-11 MED ORDER — DIPHENHYDRAMINE HCL 25 MG PO CAPS: 25.0000 mg | ORAL_CAPSULE | Freq: Four times a day (QID) | ORAL | Status: DC | PRN

## 2015-01-11 MED ORDER — DIBUCAINE 1 % RE OINT: 1.0000 "application " | TOPICAL_OINTMENT | RECTAL | Status: DC | PRN

## 2015-01-11 MED ORDER — LIDOCAINE HCL (PF) 1 % IJ SOLN
30.0000 mL | INTRAMUSCULAR | Status: DC | PRN
Start: 2015-01-11 — End: 2015-01-11
  Filled 2015-01-11: qty 30

## 2015-01-11 MED ORDER — ACETAMINOPHEN 325 MG PO TABS: 650.0000 mg | ORAL_TABLET | ORAL | Status: DC | PRN

## 2015-01-11 MED ORDER — LACTATED RINGERS IV SOLN
500.0000 mL | Freq: Once | INTRAVENOUS | Status: AC
  Administered 2015-01-11: 500 mL via INTRAVENOUS

## 2015-01-11 NOTE — Progress Notes (Signed)
Patient ID: Jeanette Wright, female   DOB: 1995-12-27, 19 y.o.   MRN: [id number]  H&P reviewed Tdap in prenatal care Spotted through out prenatal care, documented on several occasions First Trimester screen and AFP WNL  [place] on admission did not reveal abruption.
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
## 2015-01-11 NOTE — Anesthesia Procedure Notes (Addendum)
Epidural Patient location during procedure: OB Start time: 01/11/2015 8:39 AM End time: 01/11/2015 8:43 AM  Staffing Anesthesiologist: Lyn Hollingshead Performed by: anesthesiologist   Preanesthetic Checklist Completed: patient identified, site marked, surgical consent, pre-op evaluation, timeout performed, IV checked, risks and benefits discussed and monitors and equipment checked  Epidural Patient position: sitting Prep: site prepped and draped and DuraPrep Patient monitoring: continuous pulse ox and blood pressure Approach: midline Location: L3-L4 Injection technique: LOR air  Needle:  Needle type: Tuohy  Needle gauge: 17 G Needle length: 9 cm and 9 Needle insertion depth: 6 cm Catheter type: closed end flexible Catheter size: 19 Gauge Catheter at skin depth: 11 cm Test dose: negative and Other  Assessment Sensory level: T9 Events: blood not aspirated, injection not painful, no injection resistance, negative IV test and no paresthesia  Additional Notes Reason for block:procedure for pain

## 2015-01-11 NOTE — Progress Notes (Signed)
Patient ID: Jeanette Wright, female   DOB: 12/18/1995, 19 y.o.   MRN: [id number]  Comfortable with epidural  AFVSS gen NAD FHTs 130's, good var, category 1 toco q 2-62min  SVE 2.3/90/0  Continue IOL

## 2015-01-11 NOTE — Progress Notes (Signed)
Patient ID: Jeanette Wright, female   DOB: 1996-10-29, 19 y.o.   MRN: [id number]  Pt with pressure, very uncomfortable  AFVSS gen NAD FHTs 130-140's, good var, category 1 toco q 2-3 min  SVE 9/100/+1  Lip from 9 - 3. D/w pt Will recheck in about 30 min.

## 2015-01-11 NOTE — Progress Notes (Signed)
Patient ID: Jeanette Wright, female   DOB: Nov 18, 1996, 19 y.o.   MRN: [id number]  Comfortable with epidural  AROM for clear fluid, w/o difficulty or complication SVE 7/54-49/-2  Continue IOL

## 2015-01-11 NOTE — MAU Note (Signed)
Pt reports vaginal bleeding since 2305, some pain that comes and goes. Denies problems with pregnancy.
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
## 2015-01-11 NOTE — MAU Note (Signed)
Pt complaining of bleeding that started at 2305. Pt states is also feeling some pain

## 2015-01-11 NOTE — Anesthesia Preprocedure Evaluation (Signed)

## 2015-01-12 LAB — CBC
HCT: 24.4 % — ABNORMAL LOW (ref 36.0–46.0)
Hemoglobin: 7.8 g/dL — ABNORMAL LOW (ref 12.0–15.0)
MCH: 26.5 pg (ref 26.0–34.0)
MCHC: 32 g/dL (ref 30.0–36.0)
MCV: 83 fL (ref 78.0–100.0)
PLATELETS: 161 10*3/uL (ref 150–400)
RBC: 2.94 MIL/uL — AB (ref 3.87–5.11)
RDW: 13.9 % (ref 11.5–15.5)
WBC: 19.1 10*3/uL — AB (ref 4.0–10.5)

## 2015-01-12 MED ORDER — IBUPROFEN 800 MG PO TABS: 800.0000 mg | ORAL_TABLET | Freq: Three times a day (TID) | ORAL | Status: DC | PRN

## 2015-01-12 MED ORDER — PRENATAL MULTIVITAMIN CH: 1.0000 | ORAL_TABLET | Freq: Every day | ORAL | Status: DC

## 2015-01-12 MED ORDER — OXYCODONE-ACETAMINOPHEN 5-325 MG PO TABS: 1.0000 | ORAL_TABLET | Freq: Four times a day (QID) | ORAL | Status: DC | PRN

## 2015-01-12 NOTE — Anesthesia Postprocedure Evaluation (Signed)
Anesthesia Post Note  Patient: Jeanette Wright  Procedure(s) Performed: * No procedures listed *  Anesthesia type: Epidural  Patient location: Mother/Baby  Post pain: Pain level controlled  Post assessment: Post-op Vital signs reviewed  Last Vitals:  Filed Vitals:   01/12/15 0638  BP: 110/57  Pulse: 68  Temp: 36.7 C  Resp: 18    Post vital signs: Reviewed  Level of consciousness:alert  Complications: No apparent anesthesia complications
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
## 2015-01-12 NOTE — Progress Notes (Signed)
Ur chart review completed.  

## 2015-01-12 NOTE — Progress Notes (Addendum)
Post Partum Day 1 Subjective: no complaints, up ad lib, voiding, tolerating PO and nl lochia, pain controlled  Objective: Blood pressure 110/57, pulse 68, temperature 98.1 F (36.7 C), temperature source Oral, resp. rate 18, height 5\' 5"  (1.651 m), weight 71.668 kg (158 lb), last menstrual period 03/02/2014, SpO2 100 %, unknown if currently breastfeeding.  Physical Exam:  General: alert and no distress Lochia: appropriate Uterine Fundus: firm   Recent Labs  01/11/15 0351 01/12/15 0530  HGB 9.6* 7.8*  HCT 29.9* 24.4*    Assessment/Plan: Plan for discharge tomorrow.  Routine care.  Desires d/c today, paperwork done, nursery aware.  D/C with motrin, percocet, PNV, f/u 6 wks.   LOS: 1 day   Bovard-Stuckert, David Towson 01/12/2015, 7:48 AM

## 2015-01-12 NOTE — Discharge Summary (Signed)
Obstetric Discharge Summary Reason for Admission: term with vaginal bleeding, for induction of labor Prenatal Procedures: none Intrapartum Procedures: spontaneous vaginal delivery Postpartum Procedures: none Complications-Operative and Postpartum: abrasions, no stitches HEMOGLOBIN  Date Value Ref Range Status  01/12/2015 7.8* 12.0 - 15.0 g/dL Final    Comment:    DELTA CHECK NOTED REPEATED TO VERIFY    HCT  Date Value Ref Range Status  01/12/2015 24.4* 36.0 - 46.0 % Final    Physical Exam:  General: alert and no distress Lochia: appropriate Uterine Fundus: firm   Discharge Diagnoses: Term Pregnancy-delivered  Discharge Information: Date: 01/12/2015 Activity: pelvic rest Diet: routine Medications: PNV, Ibuprofen and Percocet Condition: stable Instructions: refer to practice specific booklet Discharge to: home Follow-up Information    Follow up with Bovard-Stuckert, Rogue Pautler, MD. Schedule an appointment as soon as possible for a visit in 6 weeks.   Specialty:  Obstetrics and Gynecology   Why:  for post partum check   Contact information:   [address] [address] [phone]       Newborn Data: Live born female  Birth Weight: 7 lb 7.1 oz (3375 g) APGAR: 9, 9  Home with mother.  Bovard-Stuckert, Geroge Gilliam 01/12/2015, 8:05 AM

## 2016-09-24 ENCOUNTER — Emergency Department
Admission: EM | Admit: 2016-09-24 | Discharge: 2016-09-24 | Disposition: A | Discharge status: Home / Self Care | Payer: Self-pay | Attending: Student in an Organized Health Care Education/Training Program | Admitting: Student in an Organized Health Care Education/Training Program

## 2016-09-24 ENCOUNTER — Encounter: Payer: Self-pay | Admitting: Emergency Medicine

## 2016-09-24 ENCOUNTER — Emergency Department: Payer: Self-pay

## 2016-09-24 DIAGNOSIS — R102 Pelvic and perineal pain: Secondary | ICD-10-CM

## 2016-09-24 DIAGNOSIS — N730 Acute parametritis and pelvic cellulitis: Secondary | ICD-10-CM | POA: Insufficient documentation

## 2016-09-24 LAB — URINALYSIS COMPLETE WITH MICROSCOPIC (ARMC ONLY)
BACTERIA UA: NONE SEEN
Bilirubin Urine: NEGATIVE
GLUCOSE, UA: NEGATIVE mg/dL
HGB URINE DIPSTICK: NEGATIVE
LEUKOCYTES UA: NEGATIVE
Nitrite: NEGATIVE
PROTEIN: NEGATIVE mg/dL
SPECIFIC GRAVITY, URINE: 1.026 (ref 1.005–1.030)
pH: 6 (ref 5.0–8.0)

## 2016-09-24 LAB — WET PREP, GENITAL
CLUE CELLS WET PREP: NONE SEEN
Sperm: NONE SEEN
Trich, Wet Prep: NONE SEEN
YEAST WET PREP: NONE SEEN

## 2016-09-24 LAB — POCT PREGNANCY, URINE: PREG TEST UR: NEGATIVE

## 2016-09-24 LAB — CHLAMYDIA/NGC RT PCR (ARMC ONLY)
Chlamydia Tr: NOT DETECTED
N gonorrhoeae: NOT DETECTED

## 2016-09-24 MED ORDER — AZITHROMYCIN 1 G PO PACK
1.0000 g | PACK | Freq: Once | ORAL | Status: AC
Start: 2016-09-24 — End: 2016-09-24
  Administered 2016-09-24: 1 g via ORAL
  Filled 2016-09-24 (×2): qty 1

## 2016-09-24 MED ORDER — CEFTRIAXONE SODIUM 250 MG IJ SOLR
250.0000 mg | Freq: Once | INTRAMUSCULAR | Status: AC
  Administered 2016-09-24: 250 mg via INTRAMUSCULAR
  Filled 2016-09-24: qty 250

## 2016-09-24 MED ORDER — HYDROCODONE-ACETAMINOPHEN 5-325 MG PO TABS
1.0000 | ORAL_TABLET | Freq: Once | ORAL | Status: AC
  Administered 2016-09-24: 1 via ORAL
  Filled 2016-09-24: qty 1

## 2016-09-24 MED ORDER — PROMETHAZINE HCL 12.5 MG PO TABS: 12.5000 mg | ORAL_TABLET | Freq: Four times a day (QID) | ORAL | 0 refills | Status: DC | PRN

## 2016-09-24 MED ORDER — DOXYCYCLINE HYCLATE 50 MG PO CAPS: 50.0000 mg | ORAL_CAPSULE | Freq: Two times a day (BID) | ORAL | 0 refills | Status: AC

## 2016-09-24 MED ORDER — PROMETHAZINE HCL 25 MG PO TABS
25.0000 mg | ORAL_TABLET | Freq: Once | ORAL | Status: AC
  Administered 2016-09-24: 25 mg via ORAL
  Filled 2016-09-24: qty 1
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
## 2016-09-24 NOTE — ED Triage Notes (Signed)
C/O pelvic pain "for a while" (3-4 months).  Has appointment scheduled for next Friday.  Also patient reports weight loss since pain began. Patient states she has a Mirena IUD in place x 2 years.

## 2016-09-24 NOTE — ED Notes (Signed)
Pt discharged home after verbalizing understanding of discharge instructions; nad noted. 

## 2017-03-21 ENCOUNTER — Other Ambulatory Visit: Payer: Self-pay | Admitting: Primary Care

## 2017-03-21 DIAGNOSIS — N6001 Solitary cyst of right breast: Secondary | ICD-10-CM

## 2017-03-27 ENCOUNTER — Ambulatory Visit
Admission: RE | Admit: 2017-03-27 | Discharge: 2017-03-27 | Disposition: A | Discharge status: Home / Self Care | Payer: Medicaid Other | Source: Ambulatory Visit | Attending: Primary Care | Admitting: Primary Care

## 2017-03-27 DIAGNOSIS — N6001 Solitary cyst of right breast: Secondary | ICD-10-CM | POA: Diagnosis not present

## 2017-03-31 ENCOUNTER — Encounter: Payer: Self-pay | Admitting: Emergency Medicine

## 2017-03-31 ENCOUNTER — Emergency Department
Admission: EM | Admit: 2017-03-31 | Discharge: 2017-03-31 | Disposition: A | Discharge status: Home / Self Care | Payer: Medicaid Other | Attending: Emergency Medicine | Admitting: Emergency Medicine

## 2017-03-31 ENCOUNTER — Emergency Department: Payer: Medicaid Other

## 2017-03-31 DIAGNOSIS — Y92009 Unspecified place in unspecified non-institutional (private) residence as the place of occurrence of the external cause: Secondary | ICD-10-CM | POA: Insufficient documentation

## 2017-03-31 DIAGNOSIS — Y999 Unspecified external cause status: Secondary | ICD-10-CM | POA: Diagnosis not present

## 2017-03-31 DIAGNOSIS — S63656A Sprain of metacarpophalangeal joint of right little finger, initial encounter: Secondary | ICD-10-CM | POA: Diagnosis not present

## 2017-03-31 DIAGNOSIS — S60221A Contusion of right hand, initial encounter: Secondary | ICD-10-CM

## 2017-03-31 DIAGNOSIS — Y9389 Activity, other specified: Secondary | ICD-10-CM | POA: Insufficient documentation

## 2017-03-31 DIAGNOSIS — W2201XA Walked into wall, initial encounter: Secondary | ICD-10-CM | POA: Diagnosis not present

## 2017-03-31 DIAGNOSIS — S6991XA Unspecified injury of right wrist, hand and finger(s), initial encounter: Secondary | ICD-10-CM | POA: Diagnosis present

## 2017-03-31 MED ORDER — IBUPROFEN 800 MG PO TABS
800.0000 mg | ORAL_TABLET | Freq: Once | ORAL | Status: DC
  Filled 2017-03-31: qty 1

## 2017-03-31 MED ORDER — ACETAMINOPHEN 325 MG PO TABS
650.0000 mg | ORAL_TABLET | Freq: Once | ORAL | Status: AC
Start: 2017-03-31 — End: 2017-03-31
  Administered 2017-03-31: 650 mg via ORAL
  Filled 2017-03-31: qty 2

## 2017-03-31 NOTE — Discharge Instructions (Signed)
Your exam and x-ray are normal today. There is no evidence of fracture or dislocation. Wear the buddy tape and apply ice packs to reduce swelling. Follow-up with [facility] or Dr. Roland Rack for continued symptoms. Take naproxen sodium as directed for pain and inflammation.
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
## 2017-03-31 NOTE — ED Triage Notes (Signed)
R hand pain since injured today.

## 2017-03-31 NOTE — ED Notes (Signed)
See triage note  Having pain to right hand since yesterday   Positive injury yesterday

## 2017-05-17 ENCOUNTER — Emergency Department
Admission: EM | Admit: 2017-05-17 | Discharge: 2017-05-17 | Disposition: A | Discharge status: Home / Self Care | Payer: Medicaid Other | Attending: Emergency Medicine | Admitting: Emergency Medicine

## 2017-05-17 ENCOUNTER — Encounter: Payer: Self-pay | Admitting: Emergency Medicine

## 2017-05-17 DIAGNOSIS — R21 Rash and other nonspecific skin eruption: Secondary | ICD-10-CM | POA: Insufficient documentation

## 2017-05-17 MED ORDER — DOXYCYCLINE HYCLATE 100 MG PO CAPS: 100.0000 mg | ORAL_CAPSULE | Freq: Two times a day (BID) | ORAL | 0 refills | Status: DC

## 2017-05-17 NOTE — ED Triage Notes (Signed)
Rash L forearm x 5 days.

## 2017-05-17 NOTE — Discharge Instructions (Signed)
Please take the doxycycline until you are lab tests have resulted. If you have not heard anything from [place] in approximately 3 days, please call for your results. Follow-up with your primary care provider in about a week for recheck. Return to the emergency department for any symptom that changes or worsens after unable to schedule an appointment. Remember that while you are taking the doxycycline to wear additional sunscreen if you are going to be outside. Doxycycline will cause your skin to burn easily.
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
## 2017-05-19 LAB — B. BURGDORFI ANTIBODIES: B burgdorferi Ab IgG+IgM: <0.91 {ISR} (ref 0.00–0.90)

## 2017-05-20 ENCOUNTER — Telehealth: Payer: Self-pay | Admitting: Emergency Medicine

## 2017-05-20 LAB — ROCKY MTN SPOTTED FVR ABS PNL(IGG+IGM)
RMSF IGM: 0.66 {index} (ref 0.00–0.89)
RMSF IgG: NEGATIVE

## 2017-05-20 NOTE — Telephone Encounter (Signed)
Patient called for lyme/rmsf results. Explained results.

## 2017-06-26 ENCOUNTER — Emergency Department: Payer: Self-pay

## 2017-06-26 ENCOUNTER — Emergency Department
Admission: EM | Admit: 2017-06-26 | Discharge: 2017-06-26 | Disposition: A | Discharge status: Home / Self Care | Payer: Self-pay | Attending: Emergency Medicine | Admitting: Emergency Medicine

## 2017-06-26 DIAGNOSIS — M25512 Pain in left shoulder: Secondary | ICD-10-CM | POA: Insufficient documentation

## 2017-06-26 DIAGNOSIS — R202 Paresthesia of skin: Secondary | ICD-10-CM | POA: Insufficient documentation

## 2017-06-26 DIAGNOSIS — R0789 Other chest pain: Secondary | ICD-10-CM | POA: Insufficient documentation

## 2017-06-26 DIAGNOSIS — F410 Panic disorder [episodic paroxysmal anxiety] without agoraphobia: Secondary | ICD-10-CM | POA: Insufficient documentation

## 2017-06-26 LAB — POCT PREGNANCY, URINE: PREG TEST UR: NEGATIVE

## 2017-06-26 LAB — BASIC METABOLIC PANEL
ANION GAP: 9 (ref 5–15)
BUN: 15 mg/dL (ref 6–20)
CALCIUM: 9.5 mg/dL (ref 8.9–10.3)
CO2: 23 mmol/L (ref 22–32)
Chloride: 109 mmol/L (ref 101–111)
Creatinine, Ser: 0.9 mg/dL (ref 0.44–1.00)
Glucose, Bld: 84 mg/dL (ref 65–99)
Potassium: 3.6 mmol/L (ref 3.5–5.1)
SODIUM: 141 mmol/L (ref 135–145)

## 2017-06-26 LAB — CBC
HCT: 40.6 % (ref 35.0–47.0)
HEMOGLOBIN: 13.5 g/dL (ref 12.0–16.0)
MCH: 30.7 pg (ref 26.0–34.0)
MCHC: 33.3 g/dL (ref 32.0–36.0)
MCV: 92.4 fL (ref 80.0–100.0)
Platelets: 208 10*3/uL (ref 150–440)
RBC: 4.39 MIL/uL (ref 3.80–5.20)
RDW: 13 % (ref 11.5–14.5)
WBC: 7 10*3/uL (ref 3.6–11.0)

## 2017-06-26 LAB — TROPONIN I: Troponin I: <0.03 ng/mL (ref <0.03)

## 2017-06-26 MED ORDER — ASPIRIN 81 MG PO CHEW
324.0000 mg | CHEWABLE_TABLET | Freq: Once | ORAL | Status: AC
  Administered 2017-06-26: 324 mg via ORAL
  Filled 2017-06-26: qty 4
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
## 2017-06-26 NOTE — ED Notes (Signed)
Dr Karma Greaser at bedside.

## 2017-06-26 NOTE — Discharge Instructions (Signed)
You have been seen in the Emergency Department (ED) today for chest pain.  As we have discussed today?s test results are normal, and we feel it is likely that panic attacks may be causing your symptoms, as well as on-going anxiety and depression.    Please follow up with the recommended doctor as instructed above in these documents regarding today?s emergent visit and your recent symptoms to discuss further management.  Continue to take your regular medications.   PLEASE follow up with either [facility], [place], or both, to discuss your depression and anxiety, how they are affecting your life, and to discuss treatment options that may be beneficial.  Return to the Emergency Department (ED) if you experience any further chest pain/pressure/tightness, difficulty breathing, or sudden sweating, or other symptoms that concern you.

## 2017-06-26 NOTE — ED Triage Notes (Signed)
Pt reports sharp pain to left side of chest that began today with some sob. Pt tearful in triage. Pt in NAD at this time.

## 2017-11-02 ENCOUNTER — Encounter: Payer: Self-pay | Admitting: Emergency Medicine

## 2017-11-02 ENCOUNTER — Other Ambulatory Visit: Payer: Self-pay

## 2017-11-02 ENCOUNTER — Emergency Department
Admission: EM | Admit: 2017-11-02 | Discharge: 2017-11-02 | Disposition: A | Discharge status: Home / Self Care | Payer: Self-pay | Attending: Emergency Medicine | Admitting: Emergency Medicine

## 2017-11-02 ENCOUNTER — Emergency Department: Payer: Self-pay

## 2017-11-02 DIAGNOSIS — R1013 Epigastric pain: Secondary | ICD-10-CM | POA: Insufficient documentation

## 2017-11-02 DIAGNOSIS — K219 Gastro-esophageal reflux disease without esophagitis: Secondary | ICD-10-CM | POA: Insufficient documentation

## 2017-11-02 DIAGNOSIS — R109 Unspecified abdominal pain: Secondary | ICD-10-CM

## 2017-11-02 DIAGNOSIS — R1011 Right upper quadrant pain: Secondary | ICD-10-CM | POA: Insufficient documentation

## 2017-11-02 LAB — COMPREHENSIVE METABOLIC PANEL
ALBUMIN: 4.3 g/dL (ref 3.5–5.0)
ALK PHOS: 51 U/L (ref 38–126)
ALT: 24 U/L (ref 14–54)
AST: 21 U/L (ref 15–41)
Anion gap: 8 (ref 5–15)
BILIRUBIN TOTAL: 0.4 mg/dL (ref 0.3–1.2)
BUN: 12 mg/dL (ref 6–20)
CALCIUM: 9.3 mg/dL (ref 8.9–10.3)
CO2: 23 mmol/L (ref 22–32)
Chloride: 108 mmol/L (ref 101–111)
Creatinine, Ser: 0.78 mg/dL (ref 0.44–1.00)
GFR calc Af Amer: >60 mL/min (ref >60)
GLUCOSE: 98 mg/dL (ref 65–99)
Potassium: 4.8 mmol/L (ref 3.5–5.1)
Sodium: 139 mmol/L (ref 135–145)
TOTAL PROTEIN: 7.7 g/dL (ref 6.5–8.1)

## 2017-11-02 LAB — URINALYSIS, COMPLETE (UACMP) WITH MICROSCOPIC
Bacteria, UA: NONE SEEN
Bilirubin Urine: NEGATIVE
GLUCOSE, UA: NEGATIVE mg/dL
Ketones, ur: NEGATIVE mg/dL
Leukocytes, UA: NEGATIVE
NITRITE: NEGATIVE
Protein, ur: NEGATIVE mg/dL
SPECIFIC GRAVITY, URINE: 1.021 (ref 1.005–1.030)
pH: 5 (ref 5.0–8.0)

## 2017-11-02 LAB — CBC
HCT: 40.8 % (ref 35.0–47.0)
HEMOGLOBIN: 13.6 g/dL (ref 12.0–16.0)
MCH: 31 pg (ref 26.0–34.0)
MCHC: 33.4 g/dL (ref 32.0–36.0)
MCV: 92.8 fL (ref 80.0–100.0)
Platelets: 211 10*3/uL (ref 150–440)
RBC: 4.39 MIL/uL (ref 3.80–5.20)
RDW: 12.5 % (ref 11.5–14.5)
WBC: 6.2 10*3/uL (ref 3.6–11.0)

## 2017-11-02 LAB — POCT PREGNANCY, URINE: Preg Test, Ur: NEGATIVE

## 2017-11-02 LAB — LIPASE, BLOOD: Lipase: 33 U/L (ref 11–51)

## 2017-11-02 MED ORDER — MORPHINE SULFATE (PF) 4 MG/ML IV SOLN: 4.0000 mg | Freq: Once | INTRAVENOUS | Status: DC

## 2017-11-02 MED ORDER — SODIUM CHLORIDE 0.9 % IV BOLUS (SEPSIS)
500.0000 mL | Freq: Once | INTRAVENOUS | Status: AC
  Administered 2017-11-02: 500 mL via INTRAVENOUS

## 2017-11-02 MED ORDER — ONDANSETRON 4 MG PO TBDP
4.0000 mg | ORAL_TABLET | Freq: Once | ORAL | Status: AC | PRN
  Administered 2017-11-02: 4 mg via ORAL
  Filled 2017-11-02: qty 1

## 2017-11-02 MED ORDER — FAMOTIDINE 20 MG PO TABS: 20.0000 mg | ORAL_TABLET | Freq: Two times a day (BID) | ORAL | 1 refills | Status: DC

## 2017-11-02 MED ORDER — GI COCKTAIL ~~LOC~~
30.0000 mL | Freq: Once | ORAL | Status: AC
  Administered 2017-11-02: 30 mL via ORAL
  Filled 2017-11-02: qty 30

## 2017-11-02 NOTE — ED Notes (Signed)
Returned from ultrasound.  Patient reports that after gi cocktail she was pain free, pain returned (7/10) while having ultrasound and abdomen was being pressed on.  MD notified and instructed to wait for ultrasound results to return.

## 2017-11-02 NOTE — ED Triage Notes (Signed)
Pt presents to ED via POV c/o R and L upper abd pain. Pt states she has had this pain going on a month now (worse today) and her PCP told her that she may have gallstones. PCP stated they couldn't do an ultrasound for patient because of lack of insurance.  +nausea
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
## 2017-11-02 NOTE — ED Notes (Signed)
Patient transported to Ultrasound 

## 2018-05-07 ENCOUNTER — Other Ambulatory Visit (HOSPITAL_COMMUNITY): Payer: Self-pay | Admitting: Physician Assistant

## 2018-05-07 DIAGNOSIS — Z3687 Encounter for antenatal screening for uncertain dates: Secondary | ICD-10-CM

## 2018-05-07 DIAGNOSIS — Z3201 Encounter for pregnancy test, result positive: Secondary | ICD-10-CM

## 2018-05-08 ENCOUNTER — Emergency Department: Payer: Medicaid Other

## 2018-05-08 ENCOUNTER — Emergency Department
Admission: EM | Admit: 2018-05-08 | Discharge: 2018-05-08 | Disposition: A | Discharge status: Home / Self Care | Payer: Medicaid Other | Attending: Emergency Medicine | Admitting: Emergency Medicine

## 2018-05-08 ENCOUNTER — Encounter: Payer: Self-pay | Admitting: Emergency Medicine

## 2018-05-08 DIAGNOSIS — O200 Threatened abortion: Secondary | ICD-10-CM

## 2018-05-08 DIAGNOSIS — R1031 Right lower quadrant pain: Secondary | ICD-10-CM | POA: Insufficient documentation

## 2018-05-08 DIAGNOSIS — O9989 Other specified diseases and conditions complicating pregnancy, childbirth and the puerperium: Secondary | ICD-10-CM | POA: Diagnosis not present

## 2018-05-08 DIAGNOSIS — R1030 Lower abdominal pain, unspecified: Secondary | ICD-10-CM

## 2018-05-08 LAB — LIPASE, BLOOD: Lipase: 35 U/L (ref 11–51)

## 2018-05-08 LAB — COMPREHENSIVE METABOLIC PANEL
ALT: 18 U/L (ref 14–54)
ANION GAP: 8 (ref 5–15)
AST: 19 U/L (ref 15–41)
Albumin: 4.2 g/dL (ref 3.5–5.0)
Alkaline Phosphatase: 63 U/L (ref 38–126)
BILIRUBIN TOTAL: 0.5 mg/dL (ref 0.3–1.2)
BUN: 15 mg/dL (ref 6–20)
CO2: 21 mmol/L — ABNORMAL LOW (ref 22–32)
Calcium: 9.1 mg/dL (ref 8.9–10.3)
Chloride: 110 mmol/L (ref 101–111)
Creatinine, Ser: 0.7 mg/dL (ref 0.44–1.00)
GFR calc Af Amer: >60 mL/min (ref >60)
GFR calc non Af Amer: >60 mL/min (ref >60)
Glucose, Bld: 96 mg/dL (ref 65–99)
POTASSIUM: 3.8 mmol/L (ref 3.5–5.1)
Sodium: 139 mmol/L (ref 135–145)
TOTAL PROTEIN: 7.7 g/dL (ref 6.5–8.1)

## 2018-05-08 LAB — CBC
HCT: 39 % (ref 35.0–47.0)
Hemoglobin: 13.5 g/dL (ref 12.0–16.0)
MCH: 32.1 pg (ref 26.0–34.0)
MCHC: 34.5 g/dL (ref 32.0–36.0)
MCV: 92.8 fL (ref 80.0–100.0)
Platelets: 228 10*3/uL (ref 150–440)
RBC: 4.2 MIL/uL (ref 3.80–5.20)
RDW: 12.8 % (ref 11.5–14.5)
WBC: 8.7 10*3/uL (ref 3.6–11.0)

## 2018-05-08 LAB — URINALYSIS, COMPLETE (UACMP) WITH MICROSCOPIC
Bacteria, UA: NONE SEEN
Bilirubin Urine: NEGATIVE
Glucose, UA: NEGATIVE mg/dL
Hgb urine dipstick: NEGATIVE
Ketones, ur: NEGATIVE mg/dL
Leukocytes, UA: NEGATIVE
Nitrite: NEGATIVE
Protein, ur: NEGATIVE mg/dL
Specific Gravity, Urine: 1.028 (ref 1.005–1.030)
pH: 5 (ref 5.0–8.0)

## 2018-05-08 LAB — HCG, QUANTITATIVE, PREGNANCY: HCG, BETA CHAIN, QUANT, S: 718 m[IU]/mL — AB (ref <5)

## 2018-05-08 MED ORDER — HYDROCODONE-ACETAMINOPHEN 5-325 MG PO TABS: 1.0000 | ORAL_TABLET | ORAL | 0 refills | Status: DC | PRN

## 2018-05-08 NOTE — ED Notes (Signed)
Patient returned from ultrasound.

## 2018-05-08 NOTE — Discharge Instructions (Addendum)
Please follow-up with your doctor on Monday for recheck of your pregnancy hormone level (beta hCG) today's level is 718.  As we discussed your ultrasound is most consistent with a likely miscarriage although could be consistent with an early pregnancy in the right setting.  Please follow-up with your doctor on Monday regardless for recheck/reevaluation.  You may take your pain medication or Tylenol as written on the box if needed.  Return to the emergency department for any worsening abdominal pain or development of fever.

## 2018-05-08 NOTE — ED Triage Notes (Signed)
Pt state she has had problems with her gallbladder and was told she had "sludge in it"

## 2018-05-08 NOTE — ED Triage Notes (Signed)
Pt states she has been having cramps for the last 3 days that are worse today.  Pt is crying in triage.  Pt denies any spotting, and says she found out Wednesday she was approximately [redacted] weeks pregnant.  She states she has had a miscarriage before and this feels similar.
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
## 2018-05-14 ENCOUNTER — Ambulatory Visit: Payer: Self-pay

## 2018-07-25 ENCOUNTER — Emergency Department: Payer: Medicaid Other

## 2018-07-25 ENCOUNTER — Emergency Department
Admission: EM | Admit: 2018-07-25 | Discharge: 2018-07-26 | Disposition: A | Discharge status: Home / Self Care | Payer: Medicaid Other | Attending: Emergency Medicine | Admitting: Emergency Medicine

## 2018-07-25 ENCOUNTER — Other Ambulatory Visit: Payer: Self-pay

## 2018-07-25 DIAGNOSIS — Z3A16 16 weeks gestation of pregnancy: Secondary | ICD-10-CM | POA: Insufficient documentation

## 2018-07-25 DIAGNOSIS — R1030 Lower abdominal pain, unspecified: Secondary | ICD-10-CM

## 2018-07-25 DIAGNOSIS — Z79899 Other long term (current) drug therapy: Secondary | ICD-10-CM | POA: Diagnosis not present

## 2018-07-25 DIAGNOSIS — O26892 Other specified pregnancy related conditions, second trimester: Secondary | ICD-10-CM | POA: Diagnosis not present

## 2018-07-25 LAB — URINALYSIS, COMPLETE (UACMP) WITH MICROSCOPIC
Bacteria, UA: NONE SEEN
Bilirubin Urine: NEGATIVE
GLUCOSE, UA: NEGATIVE mg/dL
KETONES UR: 80 mg/dL — AB
Nitrite: NEGATIVE
PH: 5 (ref 5.0–8.0)
Protein, ur: NEGATIVE mg/dL
SPECIFIC GRAVITY, URINE: 1.026 (ref 1.005–1.030)

## 2018-07-25 LAB — COMPREHENSIVE METABOLIC PANEL
ALBUMIN: 3.7 g/dL (ref 3.5–5.0)
ALT: 12 U/L (ref 0–44)
AST: 15 U/L (ref 15–41)
Alkaline Phosphatase: 53 U/L (ref 38–126)
Anion gap: 9 (ref 5–15)
BILIRUBIN TOTAL: 0.5 mg/dL (ref 0.3–1.2)
BUN: 11 mg/dL (ref 6–20)
CHLORIDE: 105 mmol/L (ref 98–111)
CO2: 22 mmol/L (ref 22–32)
Calcium: 9 mg/dL (ref 8.9–10.3)
Creatinine, Ser: 0.56 mg/dL (ref 0.44–1.00)
GFR calc Af Amer: >60 mL/min (ref >60)
GFR calc non Af Amer: >60 mL/min (ref >60)
GLUCOSE: 81 mg/dL (ref 70–99)
POTASSIUM: 3.7 mmol/L (ref 3.5–5.1)
SODIUM: 136 mmol/L (ref 135–145)
Total Protein: 7.4 g/dL (ref 6.5–8.1)

## 2018-07-25 LAB — CHLAMYDIA/NGC RT PCR (ARMC ONLY)
CHLAMYDIA TR: NOT DETECTED
N gonorrhoeae: NOT DETECTED

## 2018-07-25 LAB — LIPASE, BLOOD: Lipase: 27 U/L (ref 11–51)

## 2018-07-25 LAB — PREGNANCY, URINE: Preg Test, Ur: POSITIVE — AB

## 2018-07-25 LAB — WET PREP, GENITAL
SPERM: NONE SEEN
Trich, Wet Prep: NONE SEEN
YEAST WET PREP: NONE SEEN

## 2018-07-25 LAB — CBC
HEMATOCRIT: 34.4 % — AB (ref 35.0–47.0)
HEMOGLOBIN: 12.3 g/dL (ref 12.0–16.0)
MCH: 32.3 pg (ref 26.0–34.0)
MCHC: 35.9 g/dL (ref 32.0–36.0)
MCV: 89.9 fL (ref 80.0–100.0)
PLATELETS: 223 10*3/uL (ref 150–440)
RBC: 3.82 MIL/uL (ref 3.80–5.20)
RDW: 12.5 % (ref 11.5–14.5)
WBC: 13.2 10*3/uL — ABNORMAL HIGH (ref 3.6–11.0)

## 2018-07-25 MED ORDER — SODIUM CHLORIDE 0.9 % IV SOLN
1000.0000 mL | Freq: Once | INTRAVENOUS | Status: AC
  Administered 2018-07-25: 1000 mL via INTRAVENOUS

## 2018-07-25 MED ORDER — CEPHALEXIN 500 MG PO CAPS
500.0000 mg | ORAL_CAPSULE | Freq: Once | ORAL | Status: AC
  Administered 2018-07-25: 500 mg via ORAL
  Filled 2018-07-25: qty 1

## 2018-07-25 MED ORDER — ACETAMINOPHEN 325 MG PO TABS
650.0000 mg | ORAL_TABLET | Freq: Once | ORAL | Status: AC
  Administered 2018-07-25: 650 mg via ORAL
  Filled 2018-07-25: qty 2

## 2018-07-25 MED ORDER — MORPHINE SULFATE (PF) 2 MG/ML IV SOLN
2.0000 mg | Freq: Once | INTRAVENOUS | Status: AC
  Administered 2018-07-25: 2 mg via INTRAVENOUS
  Filled 2018-07-25: qty 1

## 2018-07-25 NOTE — ED Notes (Signed)
Patient transported to Ultrasound 
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
## 2018-07-25 NOTE — ED Triage Notes (Addendum)
Pt is [redacted] weeks pregnant. States 2 days of abd pain, back pain. States spotting initially but not currently spotting. Sees Princella Ion for [facility] care. 2nd pregnancy. States cyst on L ovary. Taken phenergan for nausea. Took one today.

## 2018-07-26 ENCOUNTER — Emergency Department: Payer: Medicaid Other

## 2018-07-26 MED ORDER — ACETAMINOPHEN 325 MG PO TABS
ORAL_TABLET | ORAL | Status: AC
  Filled 2018-07-26: qty 2

## 2018-07-26 MED ORDER — ACETAMINOPHEN 325 MG PO TABS
650.0000 mg | ORAL_TABLET | Freq: Once | ORAL | Status: AC
  Administered 2018-07-26: 650 mg via ORAL

## 2018-07-26 NOTE — ED Provider Notes (Signed)
MRI is reassuring with no evidence of appendicitis.  Stable for outpatient management.   Darel Hong, MD 07/26/18 [id number]

## 2018-07-26 NOTE — ED Notes (Signed)
Patient transported to MRI at this time with Colletta Maryland, EDT

## 2018-07-26 NOTE — ED Notes (Signed)
Pt returned to ED Rm 8 from MRI at this time.

## 2018-07-26 NOTE — Discharge Instructions (Signed)
Fortunately today your lab work and your MRI were reassuring.  Please follow-up with your primary care physician this coming Monday for recheck and return to the emergency department sooner for any concerns.  If you have any concerns once you are home that you are not improving or are in fact getting worse before you can make it to your follow-up appointment, please do not hesitate to call 911 and come back for further evaluation.  Darel Hong, MD  Results for orders placed or performed during the hospital encounter of 07/25/18  Wet prep, genital  Result Value Ref Range   Yeast Wet Prep HPF POC NONE SEEN NONE SEEN   Trich, Wet Prep NONE SEEN NONE SEEN   Clue Cells Wet Prep HPF POC PRESENT (A) NONE SEEN   WBC, Wet Prep HPF POC FEW (A) NONE SEEN   Sperm NONE SEEN   Chlamydia/NGC rt PCR ([facility] only)  Result Value Ref Range   Specimen source GC/Chlam ENDOCERVICAL    Chlamydia Tr NOT DETECTED NOT DETECTED   N gonorrhoeae NOT DETECTED NOT DETECTED  Lipase, blood  Result Value Ref Range   Lipase 27 11 - 51 U/L  Comprehensive metabolic panel  Result Value Ref Range   Sodium 136 135 - 145 mmol/L   Potassium 3.7 3.5 - 5.1 mmol/L   Chloride 105 98 - 111 mmol/L   CO2 22 22 - 32 mmol/L   Glucose, Bld 81 70 - 99 mg/dL   BUN 11 6 - 20 mg/dL   Creatinine, Ser 0.56 0.44 - 1.00 mg/dL   Calcium 9.0 8.9 - 10.3 mg/dL   Total Protein 7.4 6.5 - 8.1 g/dL   Albumin 3.7 3.5 - 5.0 g/dL   AST 15 15 - 41 U/L   ALT 12 0 - 44 U/L   Alkaline Phosphatase 53 38 - 126 U/L   Total Bilirubin 0.5 0.3 - 1.2 mg/dL   GFR calc non Af Amer >60 >60 mL/min   GFR calc Af Amer >60 >60 mL/min   Anion gap 9 5 - 15  CBC  Result Value Ref Range   WBC 13.2 (H) 3.6 - 11.0 K/uL   RBC 3.82 3.80 - 5.20 MIL/uL   Hemoglobin 12.3 12.0 - 16.0 g/dL   HCT 34.4 (L) 35.0 - 47.0 %   MCV 89.9 80.0 - 100.0 fL   MCH 32.3 26.0 - 34.0 pg   MCHC 35.9 32.0 - 36.0 g/dL   RDW 12.5 11.5 - 14.5 %   Platelets 223 150 - 440 K/uL    Urinalysis, Complete w Microscopic  Result Value Ref Range   Color, Urine YELLOW (A) YELLOW   APPearance CLEAR (A) CLEAR   Specific Gravity, Urine 1.026 1.005 - 1.030   pH 5.0 5.0 - 8.0   Glucose, UA NEGATIVE NEGATIVE mg/dL   Hgb urine dipstick SMALL (A) NEGATIVE   Bilirubin Urine NEGATIVE NEGATIVE   Ketones, ur 80 (A) NEGATIVE mg/dL   Protein, ur NEGATIVE NEGATIVE mg/dL   Nitrite NEGATIVE NEGATIVE   Leukocytes, UA MODERATE (A) NEGATIVE   RBC / HPF 0-5 0 - 5 RBC/hpf   WBC, UA 6-10 0 - 5 WBC/hpf   Bacteria, UA NONE SEEN NONE SEEN   Squamous Epithelial / LPF 0-5 0 - 5   Mucus PRESENT   Pregnancy, urine  Result Value Ref Range   Preg Test, Ur POSITIVE (A) NEGATIVE   Mr Pelvis Wo Contrast  Result Date: 07/26/2018 CLINICAL DATA:  Abdominal pain, appendicitis suspected. EXAM:  MRI ABDOMEN WITHOUT CONTRAST MRI PELVIS WITHOUT CONTRAST TECHNIQUE: Multiplanar multisequence MR imaging was performed without the administration of intravenous contrast. COMPARISON:  None. FINDINGS: Lower chest: The included heart size is normal.  No effusion. Hepatobiliary: Normal gallbladder without stones. No biliary dilatation. No enhancing mass of the liver. Pancreas:  Unremarkable Spleen:  Normal Adrenals/Urinary Tract: Normal bilateral adrenal glands, kidneys and bladder. No obstructive uropathy. Stomach/Bowel: Normal caliber appendix without inflammatory thickening nor periappendiceal fluid, example series 10/13. Next the stomach is decompressed in appearance. Normal small bowel rotation and ligament of Treitz position. No mucosal enhancement or inflammation of the small intestine. The distal and terminal ileum are unremarkable. Moderate stool retention within the colon admixed with air. No pericecal inflammation is identified. Vascular/Lymphatic:  Nonaneurysmal abdominal aorta.  No adenopathy. Other: Single live intrauterine gestation in cephalic presentation. Intact fundal and slightly right-sided placenta without  definite evidence of abruption. Amniotic fluid volume is subjectively normal without premature rupture of membrane. No previa. Musculoskeletal: No suspicious bone lesions identified. IMPRESSION: 1. Single live intrauterine gestation with fundal and right-sided intact appearing placenta and subjectively normal amniotic fluid volume without evidence of premature rupture of membranes. No abruption. No previa. 2. Normal caliber appendix without inflammation. No bowel obstruction or inflammation. Electronically Signed   By: Ashley Royalty M.D.   On: 07/26/2018 02:23   Mr Abdomen Wo Contrast  Result Date: 07/26/2018 CLINICAL DATA:  Abdominal pain, appendicitis suspected. EXAM: MRI ABDOMEN WITHOUT CONTRAST MRI PELVIS WITHOUT CONTRAST TECHNIQUE: Multiplanar multisequence MR imaging was performed without the administration of intravenous contrast. COMPARISON:  None. FINDINGS: Lower chest: The included heart size is normal.  No effusion. Hepatobiliary: Normal gallbladder without stones. No biliary dilatation. No enhancing mass of the liver. Pancreas:  Unremarkable Spleen:  Normal Adrenals/Urinary Tract: Normal bilateral adrenal glands, kidneys and bladder. No obstructive uropathy. Stomach/Bowel: Normal caliber appendix without inflammatory thickening nor periappendiceal fluid, example series 10/13. Next the stomach is decompressed in appearance. Normal small bowel rotation and ligament of Treitz position. No mucosal enhancement or inflammation of the small intestine. The distal and terminal ileum are unremarkable. Moderate stool retention within the colon admixed with air. No pericecal inflammation is identified. Vascular/Lymphatic:  Nonaneurysmal abdominal aorta.  No adenopathy. Other: Single live intrauterine gestation in cephalic presentation. Intact fundal and slightly right-sided placenta without definite evidence of abruption. Amniotic fluid volume is subjectively normal without premature rupture of membrane. No previa.  Musculoskeletal: No suspicious bone lesions identified. IMPRESSION: 1. Single live intrauterine gestation with fundal and right-sided intact appearing placenta and subjectively normal amniotic fluid volume without evidence of premature rupture of membranes. No abruption. No previa. 2. Normal caliber appendix without inflammation. No bowel obstruction or inflammation. Electronically Signed   By: Ashley Royalty M.D.   On: 07/26/2018 02:23   US Ob Limited  Result Date: 07/25/2018 CLINICAL DATA:  22 year old pregnant female with pelvic pain for 2 days and spotting. Assigned gestational age of [redacted] weeks 2 days by 1st ultrasound. EXAM: LIMITED OBSTETRIC ULTRASOUND FINDINGS: Number of Fetuses: 1 Heart Rate:  158 bpm Movement: Yes Presentation: Breech Placental Location: Fundal posterior Previa: No Amniotic Fluid (Subjective):  Within normal limits. BPD: 3.3 cm 16 w  2 d MATERNAL FINDINGS: Cervix:  Appears closed.  Cervical length 3.8 cm. Uterus/Adnexae: No abnormality visualized. IMPRESSION: A single living intrauterine gestation with assigned gestational age of [redacted] weeks 2 days by 1st ultrasound. Estimated gestational age of [redacted] weeks 2 days by today's ultrasound. No acute abnormalities. This exam is performed  on an emergent basis and does not comprehensively evaluate fetal size, dating, or anatomy; follow-up complete OB US should be considered if further fetal assessment is warranted. Electronically Signed   By: Margarette Canada M.D.   On: 07/25/2018 18:30

## 2018-09-27 ENCOUNTER — Other Ambulatory Visit: Payer: Self-pay

## 2018-09-27 ENCOUNTER — Encounter: Admission: EM | Disposition: A | Discharge status: Home / Self Care | Payer: Self-pay | Source: Home / Self Care

## 2018-09-27 ENCOUNTER — Observation Stay
Admission: EM | Admit: 2018-09-27 | Discharge: 2018-09-27 | Disposition: A | Discharge status: Home / Self Care | Payer: Medicaid Other | Attending: Certified Nurse Midwife | Admitting: Certified Nurse Midwife

## 2018-09-27 DIAGNOSIS — O26899 Other specified pregnancy related conditions, unspecified trimester: Secondary | ICD-10-CM

## 2018-09-27 DIAGNOSIS — Z8261 Family history of arthritis: Secondary | ICD-10-CM | POA: Diagnosis not present

## 2018-09-27 DIAGNOSIS — R109 Unspecified abdominal pain: Secondary | ICD-10-CM | POA: Diagnosis not present

## 2018-09-27 DIAGNOSIS — Z3A24 24 weeks gestation of pregnancy: Secondary | ICD-10-CM | POA: Diagnosis not present

## 2018-09-27 DIAGNOSIS — Z818 Family history of other mental and behavioral disorders: Secondary | ICD-10-CM | POA: Insufficient documentation

## 2018-09-27 DIAGNOSIS — Z823 Family history of stroke: Secondary | ICD-10-CM | POA: Insufficient documentation

## 2018-09-27 DIAGNOSIS — Z79899 Other long term (current) drug therapy: Secondary | ICD-10-CM | POA: Diagnosis not present

## 2018-09-27 DIAGNOSIS — O26892 Other specified pregnancy related conditions, second trimester: Secondary | ICD-10-CM | POA: Diagnosis not present

## 2018-09-27 DIAGNOSIS — Z888 Allergy status to other drugs, medicaments and biological substances status: Secondary | ICD-10-CM | POA: Insufficient documentation

## 2018-09-27 DIAGNOSIS — Z833 Family history of diabetes mellitus: Secondary | ICD-10-CM | POA: Diagnosis not present

## 2018-09-27 HISTORY — DX: Other specified pregnancy related conditions, unspecified trimester: R10.9

## 2018-09-27 HISTORY — DX: Other specified pregnancy related conditions, unspecified trimester: O26.899

## 2018-09-27 LAB — URINALYSIS, COMPLETE (UACMP) WITH MICROSCOPIC
BACTERIA UA: NONE SEEN
BILIRUBIN URINE: NEGATIVE
GLUCOSE, UA: NEGATIVE mg/dL
HGB URINE DIPSTICK: NEGATIVE
Ketones, ur: 5 mg/dL — AB
NITRITE: NEGATIVE
Protein, ur: NEGATIVE mg/dL
SPECIFIC GRAVITY, URINE: 1.023 (ref 1.005–1.030)
pH: 6 (ref 5.0–8.0)

## 2018-09-27 LAB — WET PREP, GENITAL
Clue Cells Wet Prep HPF POC: NONE SEEN
Trich, Wet Prep: NONE SEEN

## 2018-09-27 LAB — CHLAMYDIA/NGC RT PCR (ARMC ONLY)
CHLAMYDIA TR: NOT DETECTED
N GONORRHOEAE: NOT DETECTED

## 2018-09-27 SURGERY — Surgical Case
Anesthesia: Spinal

## 2018-09-27 MED ORDER — ACETAMINOPHEN 325 MG PO TABS
650.0000 mg | ORAL_TABLET | ORAL | Status: DC | PRN
  Administered 2018-09-27: 650 mg via ORAL

## 2018-09-27 MED ORDER — MICONAZOLE NITRATE 2 % VA CREA: 1.0000 | TOPICAL_CREAM | Freq: Every day | VAGINAL | 0 refills | Status: DC

## 2018-09-27 MED ORDER — ACETAMINOPHEN 325 MG PO TABS
ORAL_TABLET | ORAL | Status: AC
  Filled 2018-09-27: qty 2

## 2018-09-27 MED ORDER — ACETAMINOPHEN 325 MG PO TABS: 650.0000 mg | ORAL_TABLET | ORAL | 0 refills | Status: DC | PRN

## 2018-09-27 SURGICAL SUPPLY — 31 items
CANISTER SUCT 3000ML PPV (MISCELLANEOUS) ×3 IMPLANT
CLOSURE WOUND 1/2 X4 (GAUZE/BANDAGES/DRESSINGS) ×1
COVER WAND RF STERILE (DRAPES) ×3 IMPLANT
DERMABOND ADVANCED (GAUZE/BANDAGES/DRESSINGS) ×2
DERMABOND ADVANCED .7 DNX12 (GAUZE/BANDAGES/DRESSINGS) ×1 IMPLANT
DRSG TELFA 3X8 NADH (GAUZE/BANDAGES/DRESSINGS) ×3 IMPLANT
ELECT CAUTERY BLADE 6.4 (BLADE) ×3 IMPLANT
ELECT REM PT RETURN 9FT ADLT (ELECTROSURGICAL) ×3
ELECTRODE REM PT RTRN 9FT ADLT (ELECTROSURGICAL) ×1 IMPLANT
GAUZE SPONGE 4X4 12PLY STRL (GAUZE/BANDAGES/DRESSINGS) ×3 IMPLANT
GLOVE PI ORTHOPRO 6.5 (GLOVE) ×2
GLOVE PI ORTHOPRO STRL 6.5 (GLOVE) ×1 IMPLANT
GLOVE SURG SYN 6.5 ES PF (GLOVE) ×3 IMPLANT
GOWN STRL REUS W/ TWL LRG LVL3 (GOWN DISPOSABLE) ×3 IMPLANT
GOWN STRL REUS W/TWL LRG LVL3 (GOWN DISPOSABLE) ×6
NS IRRIG 1000ML POUR BTL (IV SOLUTION) ×3 IMPLANT
PACK C SECTION AR (MISCELLANEOUS) ×3 IMPLANT
PAD OB MATERNITY 4.3X12.25 (PERSONAL CARE ITEMS) ×3 IMPLANT
PAD PREP 24X41 OB/GYN DISP (PERSONAL CARE ITEMS) ×3 IMPLANT
STRAP SAFETY 5IN WIDE (MISCELLANEOUS) ×3 IMPLANT
STRIP CLOSURE SKIN 1/2X4 (GAUZE/BANDAGES/DRESSINGS) ×2 IMPLANT
SUT MNCRL 4-0 (SUTURE) ×2
SUT MNCRL 4-0 27XMFL (SUTURE) ×1
SUT PDS AB 1 TP1 96 (SUTURE) ×3 IMPLANT
SUT VIC AB 0 CT1 36 (SUTURE) ×6 IMPLANT
SUT VIC AB 2-0 CT1 27 (SUTURE) ×2
SUT VIC AB 2-0 CT1 TAPERPNT 27 (SUTURE) ×1 IMPLANT
SUT VIC AB 3-0 SH 27 (SUTURE) ×2
SUT VIC AB 3-0 SH 27X BRD (SUTURE) ×1 IMPLANT
SUTURE MNCRL 4-0 27XMF (SUTURE) ×1 IMPLANT
SWABSTK COMLB BENZOIN TINCTURE (MISCELLANEOUS) ×3 IMPLANT

## 2018-09-27 NOTE — Discharge Summary (Signed)
RN reviewed discharge instructions with patient. Gave patient opportunity for questions. No questions at this time. Pt verbalized understanding. Pt discharged home with her family.
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
## 2018-09-27 NOTE — OB Triage Note (Signed)
Pt presents c/o going to the bathroom around 9am this morning and had some vaginal spotting. Described as bright red bleeding. Pt then started having increased pressure in her pelvis and abdominal cramping. Pt states her most recent intercourse was last night. Pt denies any LOF. Vitals WNL. Will continue to monitor.

## 2019-04-06 ENCOUNTER — Encounter (HOSPITAL_COMMUNITY): Payer: Self-pay

## 2019-10-26 ENCOUNTER — Other Ambulatory Visit: Payer: Self-pay

## 2019-10-26 DIAGNOSIS — Z20822 Contact with and (suspected) exposure to covid-19: Secondary | ICD-10-CM

## 2019-10-28 LAB — NOVEL CORONAVIRUS, NAA: SARS-CoV-2, NAA: NOT DETECTED

## 2020-03-06 ENCOUNTER — Ambulatory Visit (LOCAL_COMMUNITY_HEALTH_CENTER): Payer: Medicaid Other

## 2020-03-06 ENCOUNTER — Other Ambulatory Visit: Payer: Self-pay

## 2020-03-06 VITALS — BP 127/72 | Wt 161.5 lb

## 2020-03-06 DIAGNOSIS — Z3201 Encounter for pregnancy test, result positive: Secondary | ICD-10-CM

## 2020-03-06 MED ORDER — PRENATAL VITAMINS 28-0.8 MG PO TABS: 28.0000 mg | ORAL_TABLET | Freq: Every day | ORAL | 0 refills | Status: AC

## 2020-03-06 NOTE — Progress Notes (Signed)
Patient will seek pNC at [facility]; has appt. Aileen Fass, RN

## 2020-03-07 ENCOUNTER — Other Ambulatory Visit (HOSPITAL_COMMUNITY): Payer: Self-pay | Admitting: Physician Assistant

## 2020-03-07 ENCOUNTER — Other Ambulatory Visit: Payer: Self-pay | Admitting: Physician Assistant

## 2020-03-07 ENCOUNTER — Ambulatory Visit
Admission: RE | Admit: 2020-03-07 | Discharge: 2020-03-07 | Disposition: A | Discharge status: Home / Self Care | Payer: Medicaid Other | Source: Ambulatory Visit | Attending: Physician Assistant | Admitting: Physician Assistant

## 2020-03-07 DIAGNOSIS — O26851 Spotting complicating pregnancy, first trimester: Secondary | ICD-10-CM | POA: Insufficient documentation

## 2020-03-07 LAB — PREGNANCY, URINE: Preg Test, Ur: POSITIVE — AB

## 2020-03-13 ENCOUNTER — Other Ambulatory Visit (HOSPITAL_COMMUNITY)
Admission: RE | Admit: 2020-03-13 | Discharge: 2020-03-13 | Disposition: A | Discharge status: Home / Self Care | Payer: Medicaid Other | Source: Ambulatory Visit | Attending: Obstetrics and Gynecology | Admitting: Obstetrics and Gynecology

## 2020-03-13 ENCOUNTER — Ambulatory Visit (INDEPENDENT_AMBULATORY_CARE_PROVIDER_SITE_OTHER): Payer: Medicaid Other | Admitting: Obstetrics and Gynecology

## 2020-03-13 ENCOUNTER — Encounter: Payer: Self-pay | Admitting: Obstetrics and Gynecology

## 2020-03-13 ENCOUNTER — Other Ambulatory Visit: Payer: Self-pay

## 2020-03-13 VITALS — BP 96/64 | Wt 162.0 lb

## 2020-03-13 DIAGNOSIS — Z3480 Encounter for supervision of other normal pregnancy, unspecified trimester: Secondary | ICD-10-CM | POA: Insufficient documentation

## 2020-03-13 DIAGNOSIS — Z124 Encounter for screening for malignant neoplasm of cervix: Secondary | ICD-10-CM

## 2020-03-13 DIAGNOSIS — Z3689 Encounter for other specified antenatal screening: Secondary | ICD-10-CM

## 2020-03-13 DIAGNOSIS — Z113 Encounter for screening for infections with a predominantly sexual mode of transmission: Secondary | ICD-10-CM | POA: Diagnosis present

## 2020-03-13 DIAGNOSIS — Z31438 Encounter for other genetic testing of female for procreative management: Secondary | ICD-10-CM

## 2020-03-13 DIAGNOSIS — Z98891 History of uterine scar from previous surgery: Secondary | ICD-10-CM
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
## 2020-03-13 NOTE — Progress Notes (Signed)
NOB confirmed at [facility]/Ultrasound done

## 2020-03-14 LAB — RPR+RH+ABO+RUB AB+AB SCR+CB...
Antibody Screen: NEGATIVE
HIV Screen 4th Generation wRfx: NONREACTIVE
Hematocrit: 38 % (ref 34.0–46.6)
Hemoglobin: 12.2 g/dL (ref 11.1–15.9)
Hepatitis B Surface Ag: NEGATIVE
MCH: 28.9 pg (ref 26.6–33.0)
MCHC: 32.1 g/dL (ref 31.5–35.7)
MCV: 90 fL (ref 79–97)
Platelets: 241 10*3/uL (ref 150–450)
RBC: 4.22 x10E6/uL (ref 3.77–5.28)
RDW: 12.8 % (ref 11.7–15.4)
RPR Ser Ql: NONREACTIVE
Rh Factor: POSITIVE
Rubella Antibodies, IGG: 3.96 index (ref >0.99)
Varicella zoster IgG: 925 index (ref >165)
WBC: 6.2 10*3/uL (ref 3.4–10.8)

## 2020-03-14 LAB — CYTOLOGY - PAP
Chlamydia: NEGATIVE
Comment: NEGATIVE
Comment: NORMAL
Diagnosis: NEGATIVE
Neisseria Gonorrhea: NEGATIVE

## 2020-03-16 LAB — URINE CULTURE

## 2020-03-20 ENCOUNTER — Encounter: Payer: Medicaid Other | Admitting: Obstetrics and Gynecology

## 2020-03-20 ENCOUNTER — Other Ambulatory Visit: Payer: Medicaid Other

## 2020-03-22 LAB — INHERITEST CORE(CF97,SMA,FRAX)

## 2020-03-24 ENCOUNTER — Encounter: Payer: Self-pay | Admitting: Certified Nurse Midwife

## 2020-03-24 ENCOUNTER — Ambulatory Visit (INDEPENDENT_AMBULATORY_CARE_PROVIDER_SITE_OTHER): Payer: Medicaid Other | Admitting: Certified Nurse Midwife

## 2020-03-24 ENCOUNTER — Ambulatory Visit (INDEPENDENT_AMBULATORY_CARE_PROVIDER_SITE_OTHER): Payer: Medicaid Other

## 2020-03-24 ENCOUNTER — Other Ambulatory Visit: Payer: Self-pay

## 2020-03-24 VITALS — BP 106/54 | Wt 163.0 lb

## 2020-03-24 DIAGNOSIS — Z3689 Encounter for other specified antenatal screening: Secondary | ICD-10-CM

## 2020-03-24 DIAGNOSIS — Z3480 Encounter for supervision of other normal pregnancy, unspecified trimester: Secondary | ICD-10-CM

## 2020-03-24 DIAGNOSIS — Z3481 Encounter for supervision of other normal pregnancy, first trimester: Secondary | ICD-10-CM | POA: Diagnosis not present

## 2020-03-24 DIAGNOSIS — Z3A10 10 weeks gestation of pregnancy: Secondary | ICD-10-CM | POA: Diagnosis not present

## 2020-03-24 DIAGNOSIS — O1211 Gestational proteinuria, first trimester: Secondary | ICD-10-CM

## 2020-03-24 LAB — POCT URINALYSIS DIPSTICK OB: Glucose, UA: NEGATIVE

## 2020-03-24 LAB — POCT URINALYSIS DIPSTICK
Bilirubin, UA: NEGATIVE
Blood, UA: NEGATIVE
Glucose, UA: NEGATIVE
Ketones, UA: NEGATIVE
Nitrite, UA: NEGATIVE
Protein, UA: POSITIVE — AB
Spec Grav, UA: 1.01
Urobilinogen, UA: NEGATIVE U/dL — AB
pH, UA: >=9 — AB

## 2020-03-24 NOTE — Progress Notes (Signed)
ROB/ Dating [place] at 9wk6d: Feels fatigued and sometimes lightheaded. Has a 26 year old and a 65 mos old at home.  Desires cell free DNA testing. Denies dysuria.   Reviewed NOB lab results Ultrasound: CRL10wk 1d with FCA 164.  Urine dipstick with +2 protein/ small leukocytes/ negate nitrite/ negative blood/ Ph 9 BP 106/54  A: IUP at 9wk6d Fatigue Proteinuria-R/O UTI   P: ROB and MaterniT 21 in 1-2 weeks. Consider TSH also at that time Increase water intake Urine culture  Dalia Heading, CNM

## 2020-03-24 NOTE — Progress Notes (Signed)
ROB Dating scan Fatigue/dizziness

## 2020-03-26 LAB — URINE CULTURE

## 2020-04-07 ENCOUNTER — Ambulatory Visit (INDEPENDENT_AMBULATORY_CARE_PROVIDER_SITE_OTHER): Payer: Medicaid Other | Admitting: Obstetrics and Gynecology

## 2020-04-07 ENCOUNTER — Other Ambulatory Visit: Payer: Self-pay

## 2020-04-07 ENCOUNTER — Encounter: Payer: Self-pay | Admitting: Obstetrics and Gynecology

## 2020-04-07 VITALS — BP 118/70 | Wt 161.0 lb

## 2020-04-07 DIAGNOSIS — Z3481 Encounter for supervision of other normal pregnancy, first trimester: Secondary | ICD-10-CM

## 2020-04-07 DIAGNOSIS — Z1379 Encounter for other screening for genetic and chromosomal anomalies: Secondary | ICD-10-CM

## 2020-04-07 DIAGNOSIS — Z3A11 11 weeks gestation of pregnancy: Secondary | ICD-10-CM

## 2020-04-07 DIAGNOSIS — Z3480 Encounter for supervision of other normal pregnancy, unspecified trimester: Secondary | ICD-10-CM

## 2020-04-07 LAB — POCT URINALYSIS DIPSTICK OB
Glucose, UA: NEGATIVE
POC,PROTEIN,UA: NEGATIVE

## 2020-04-07 NOTE — Progress Notes (Signed)
Routine Prenatal Care Visit  Subjective  Jeanette Wright is a 24 y.o. [phone] at [redacted]w[redacted]d being seen today for ongoing prenatal care.  She is currently monitored for the following issues for this low-risk pregnancy and has Vaginal bleeding in pregnancy; Supervision of other normal pregnancy, antepartum; and History of cesarean section on their problem list.  ----------------------------------------------------------------------------------- Patient reports no complaints.  Some cramping pain. No bleeding.  Contractions: Not present. Vag. Bleeding: None.  Movement: Absent. Denies leaking of fluid.  ----------------------------------------------------------------------------------- The following portions of the patient's history were reviewed and updated as appropriate: allergies, current medications, past family history, past medical history, past social history, past surgical history and problem list. Problem list updated.   Objective  Blood pressure 118/70, weight 161 lb (73 kg), last menstrual period 01/07/2020, not currently breastfeeding. Pregravid weight 161 lb (73 kg) Total Weight Gain 0 lb (0 kg) Urinalysis:      Fetal Status: Fetal Heart Rate (bpm): 145   Movement: Absent     General:  Alert, oriented and cooperative. Patient is in no acute distress.  Skin: Skin is warm and dry. No rash noted.   Cardiovascular: Normal heart rate noted  Respiratory: Normal respiratory effort, no problems with respiration noted  Abdomen: Soft, gravid, appropriate for gestational age. Pain/Pressure: Present     Pelvic:  Cervical exam deferred        Extremities: Normal range of motion.     Mental Status: Normal mood and affect. Normal behavior. Normal judgment and thought content.     Assessment   24 y.o. [id number] at [redacted]w[redacted]d by  10/21/2020, by Ultrasound presenting for routine prenatal visit  Plan   Pregnancy#4 Problems (from 01/07/20 to present)    Problem Noted Resolved   Supervision  of other normal pregnancy, antepartum 03/13/2020 by Malachy Mood, MD No   Overview Addendum 03/27/2020  8:16 AM by Malachy Mood, MD    Clinic [facility] Prenatal Labs  Dating 7 week 3 day ER [place] Blood type: A/Positive/-- (04/26 1011)   Genetic Screen SMA neg, Fragile-X neg, CF neg   NIPS: Antibody:Negative (04/26 1011)  Anatomic [place]  Rubella: 3.96 (04/26 1011) Varicella: Immune  GTT Early:               Third trimester:  RPR: Non Reactive (04/26 1011)   Rhogam N/A HBsAg: Negative (04/26 1011)   TDaP vaccine                       Flu Shot: HIV: Non Reactive (04/26 1011)   Baby Food                                GBS:   Contraception  Pap: NILM 03/13/2020  CBB   Pelvis tested to 7lbs 7oz  CS/VBAC Desires TOLAC   Support Person            History of cesarean section 03/13/2020 by Malachy Mood, MD No   Overview Signed 03/13/2020  1:24 PM by Malachy Mood, MD    For breech at [place]      Vaginal bleeding in pregnancy  by Oralia Rud, MD No      Maternit21 testing today Declines medication for nausea  Gestational age appropriate obstetric precautions including but not limited to vaginal bleeding, contractions, leaking of fluid and fetal movement were reviewed in detail with the patient.    Return in  about 4 weeks (around 05/05/2020) for ROB in person.  Homero Fellers MD [facility] 04/07/2020, 4:19 PM

## 2020-04-07 NOTE — Patient Instructions (Addendum)

## 2020-04-12 LAB — MATERNIT21 PLUS CORE+SCA
Fetal Fraction: 7
Monosomy X (Turner Syndrome): NOT DETECTED
Result (T21): NEGATIVE
Trisomy 13 (Patau syndrome): NEGATIVE
Trisomy 18 (Edwards syndrome): NEGATIVE
Trisomy 21 (Down syndrome): NEGATIVE
XXX (Triple X Syndrome): NOT DETECTED
XXY (Klinefelter Syndrome): NOT DETECTED
XYY (Jacobs Syndrome): NOT DETECTED

## 2020-04-17 ENCOUNTER — Emergency Department
Admission: EM | Admit: 2020-04-17 | Discharge: 2020-04-17 | Disposition: A | Discharge status: Home / Self Care | Payer: Medicaid Other | Attending: Emergency Medicine | Admitting: Emergency Medicine

## 2020-04-17 ENCOUNTER — Emergency Department: Payer: Medicaid Other

## 2020-04-17 ENCOUNTER — Encounter: Payer: Self-pay | Admitting: Emergency Medicine

## 2020-04-17 ENCOUNTER — Other Ambulatory Visit: Payer: Self-pay

## 2020-04-17 DIAGNOSIS — O26892 Other specified pregnancy related conditions, second trimester: Secondary | ICD-10-CM | POA: Insufficient documentation

## 2020-04-17 DIAGNOSIS — R102 Pelvic and perineal pain: Secondary | ICD-10-CM | POA: Diagnosis not present

## 2020-04-17 DIAGNOSIS — B373 Candidiasis of vulva and vagina: Secondary | ICD-10-CM | POA: Insufficient documentation

## 2020-04-17 DIAGNOSIS — N760 Acute vaginitis: Secondary | ICD-10-CM | POA: Diagnosis not present

## 2020-04-17 DIAGNOSIS — N949 Unspecified condition associated with female genital organs and menstrual cycle: Secondary | ICD-10-CM | POA: Insufficient documentation

## 2020-04-17 DIAGNOSIS — Z3A13 13 weeks gestation of pregnancy: Secondary | ICD-10-CM | POA: Insufficient documentation

## 2020-04-17 DIAGNOSIS — R109 Unspecified abdominal pain: Secondary | ICD-10-CM

## 2020-04-17 DIAGNOSIS — B3731 Acute candidiasis of vulva and vagina: Secondary | ICD-10-CM

## 2020-04-17 LAB — COMPREHENSIVE METABOLIC PANEL
ALT: 13 U/L (ref 0–44)
AST: 13 U/L — ABNORMAL LOW (ref 15–41)
Albumin: 3.5 g/dL (ref 3.5–5.0)
Alkaline Phosphatase: 50 U/L (ref 38–126)
Anion gap: 8 (ref 5–15)
BUN: 11 mg/dL (ref 6–20)
CO2: 22 mmol/L (ref 22–32)
Calcium: 8.8 mg/dL — ABNORMAL LOW (ref 8.9–10.3)
Chloride: 106 mmol/L (ref 98–111)
Creatinine, Ser: 0.72 mg/dL (ref 0.44–1.00)
GFR calc Af Amer: >60 mL/min (ref >60)
GFR calc non Af Amer: >60 mL/min (ref >60)
Glucose, Bld: 84 mg/dL (ref 70–99)
Potassium: 3.7 mmol/L (ref 3.5–5.1)
Sodium: 136 mmol/L (ref 135–145)
Total Bilirubin: 0.5 mg/dL (ref 0.3–1.2)
Total Protein: 7.3 g/dL (ref 6.5–8.1)

## 2020-04-17 LAB — WET PREP, GENITAL
Sperm: NONE SEEN
Trich, Wet Prep: NONE SEEN

## 2020-04-17 LAB — CBC
HCT: 33.4 % — ABNORMAL LOW (ref 36.0–46.0)
Hemoglobin: 11.4 g/dL — ABNORMAL LOW (ref 12.0–15.0)
MCH: 29.5 pg (ref 26.0–34.0)
MCHC: 34.1 g/dL (ref 30.0–36.0)
MCV: 86.3 fL (ref 80.0–100.0)
Platelets: 225 10*3/uL (ref 150–400)
RBC: 3.87 MIL/uL (ref 3.87–5.11)
RDW: 13.4 % (ref 11.5–15.5)
WBC: 11.2 10*3/uL — ABNORMAL HIGH (ref 4.0–10.5)
nRBC: 0 % (ref 0.0–0.2)

## 2020-04-17 LAB — URINALYSIS, COMPLETE (UACMP) WITH MICROSCOPIC
Bacteria, UA: NONE SEEN
Bilirubin Urine: NEGATIVE
Glucose, UA: NEGATIVE mg/dL
Hgb urine dipstick: NEGATIVE
Ketones, ur: NEGATIVE mg/dL
Leukocytes,Ua: NEGATIVE
Nitrite: NEGATIVE
Protein, ur: NEGATIVE mg/dL
Specific Gravity, Urine: 1.025 (ref 1.005–1.030)
pH: 6 (ref 5.0–8.0)

## 2020-04-17 LAB — LIPASE, BLOOD: Lipase: 25 U/L (ref 11–51)

## 2020-04-17 LAB — HCG, QUANTITATIVE, PREGNANCY: hCG, Beta Chain, Quant, S: 81761 m[IU]/mL — ABNORMAL HIGH (ref <5)

## 2020-04-17 MED ORDER — SODIUM CHLORIDE 0.9% FLUSH: 3.0000 mL | Freq: Once | INTRAVENOUS | Status: DC

## 2020-04-17 MED ORDER — METRONIDAZOLE 500 MG PO TABS: 500.0000 mg | ORAL_TABLET | Freq: Two times a day (BID) | ORAL | 0 refills | Status: AC

## 2020-04-17 MED ORDER — METRONIDAZOLE 500 MG PO TABS
500.0000 mg | ORAL_TABLET | Freq: Once | ORAL | Status: AC
  Administered 2020-04-17: 500 mg via ORAL
  Filled 2020-04-17: qty 1

## 2020-04-17 MED ORDER — METOCLOPRAMIDE HCL 5 MG PO TABS: 5.0000 mg | ORAL_TABLET | Freq: Three times a day (TID) | ORAL | 0 refills | Status: DC | PRN

## 2020-04-17 MED ORDER — METOCLOPRAMIDE HCL 10 MG PO TABS
10.0000 mg | ORAL_TABLET | Freq: Once | ORAL | Status: AC
  Administered 2020-04-17: 10 mg via ORAL
  Filled 2020-04-17: qty 1

## 2020-04-17 MED ORDER — MONISTAT 7 COMBO PACK APP 100 & 2 MG-% (9GM) VA KIT: 1.0000 "application " | PACK | Freq: Every day | VAGINAL | 0 refills | Status: AC
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
## 2020-04-17 NOTE — ED Notes (Addendum)
See triage notes.  Pt is ambulatory to the room but states she feels weak and lightheaded; in no acute distress at this time.  Pain is 7/10 and goes up to 9/10 at times.

## 2020-04-17 NOTE — Discharge Instructions (Addendum)
Your labs and [place] were normal at this time. You have a healthy developing baby. Take the metronidazole pills for your BV and use the vaginal insert for your yeast infection. Your lower abdominal pain may be due to round ligament pain, which is normal in the second trimester. Follow-up with your OB provider as needed.

## 2020-04-17 NOTE — ED Triage Notes (Signed)
Pt presents to ED via POV with c/o lower abdominal pain, pt states pain has been ongoing, but "it hit [her] really bad today". Pt states is currently pregnant, due date is 10/19/2020, states is G3P2L2. Pt denies vaginal bleeding at this time.

## 2020-04-18 LAB — CHLAMYDIA/NGC RT PCR (ARMC ONLY)
Chlamydia Tr: NOT DETECTED
N gonorrhoeae: NOT DETECTED

## 2020-04-20 NOTE — Progress Notes (Signed)
Called patient. Left message to come pick up gneder envelope

## 2020-04-20 NOTE — Progress Notes (Signed)
Could you make this patient a gender envelope please?  Thank you,  Dr. Gilman Schmidt

## 2020-05-05 ENCOUNTER — Encounter: Payer: Medicaid Other | Admitting: Advanced Practice Midwife

## 2020-05-08 ENCOUNTER — Other Ambulatory Visit: Payer: Self-pay

## 2020-05-08 ENCOUNTER — Ambulatory Visit (INDEPENDENT_AMBULATORY_CARE_PROVIDER_SITE_OTHER): Payer: Medicaid Other | Admitting: Obstetrics

## 2020-05-08 VITALS — BP 100/60 | Wt 167.0 lb

## 2020-05-08 DIAGNOSIS — Z3482 Encounter for supervision of other normal pregnancy, second trimester: Secondary | ICD-10-CM

## 2020-05-08 DIAGNOSIS — Z3480 Encounter for supervision of other normal pregnancy, unspecified trimester: Secondary | ICD-10-CM

## 2020-05-08 DIAGNOSIS — Z3A16 16 weeks gestation of pregnancy: Secondary | ICD-10-CM

## 2020-05-08 LAB — POCT URINALYSIS DIPSTICK OB: Glucose, UA: NEGATIVE

## 2020-05-08 NOTE — Progress Notes (Signed)
IUP 16 w2days for ROB appt. Under much stress- gmother has Cancer; she is working and taking care of a toddler. Had the MaternT- but does not know gender. Denies any vaginal bleeding; denies dysuria, LOF. Occasional cramping- she fears that her stress is impacting the baby.Reassured, and to increase her fluid intake- urine was fairly concentrated. FHTs reassuring. P: RTC in 2 weeks for ROB and anatomy scan. Discussed and encouraged her to breastfeed this time- she "wasn't able to nurse with her last"  Imagene Riches, [facility]  05/08/2020 5:27 PM

## 2020-05-24 ENCOUNTER — Other Ambulatory Visit: Payer: Self-pay

## 2020-05-24 ENCOUNTER — Ambulatory Visit (INDEPENDENT_AMBULATORY_CARE_PROVIDER_SITE_OTHER): Payer: Medicaid Other

## 2020-05-24 ENCOUNTER — Ambulatory Visit (INDEPENDENT_AMBULATORY_CARE_PROVIDER_SITE_OTHER): Payer: Medicaid Other | Admitting: Advanced Practice Midwife

## 2020-05-24 VITALS — BP 118/74 | Wt 166.0 lb

## 2020-05-24 DIAGNOSIS — Z3480 Encounter for supervision of other normal pregnancy, unspecified trimester: Secondary | ICD-10-CM | POA: Diagnosis not present

## 2020-05-24 DIAGNOSIS — Z3482 Encounter for supervision of other normal pregnancy, second trimester: Secondary | ICD-10-CM

## 2020-05-24 DIAGNOSIS — Z3A18 18 weeks gestation of pregnancy: Secondary | ICD-10-CM

## 2020-05-24 NOTE — Progress Notes (Signed)
No vb. No lof. Anatomy scan today.
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
## 2020-06-07 ENCOUNTER — Other Ambulatory Visit: Payer: Self-pay

## 2020-06-07 ENCOUNTER — Encounter: Payer: Self-pay | Admitting: Advanced Practice Midwife

## 2020-06-07 ENCOUNTER — Ambulatory Visit (INDEPENDENT_AMBULATORY_CARE_PROVIDER_SITE_OTHER): Payer: Medicaid Other | Admitting: Advanced Practice Midwife

## 2020-06-07 VITALS — BP 134/69 | Wt 170.0 lb

## 2020-06-07 DIAGNOSIS — Z3A20 20 weeks gestation of pregnancy: Secondary | ICD-10-CM

## 2020-06-07 DIAGNOSIS — Z3480 Encounter for supervision of other normal pregnancy, unspecified trimester: Secondary | ICD-10-CM

## 2020-06-07 NOTE — Progress Notes (Signed)
ROB Swelling in legs, hurts behind knee Lightheaded a dew days ago x1

## 2020-06-07 NOTE — Progress Notes (Signed)
Routine Prenatal Care Visit  Subjective  Jeanette Wright is a 24 y.o. [phone] at [redacted]w[redacted]d being seen today for ongoing prenatal care.  She is currently monitored for the following issues for this low-risk pregnancy and has Vaginal bleeding in pregnancy; Supervision of other normal pregnancy, antepartum; and History of cesarean section on their problem list.  ----------------------------------------------------------------------------------- Patient reports swelling in her lower extremities on Monday of this week. She also experienced an episode of feeling lightheaded the same day. She denies any problems with either today. In addition she has some pain and numbness in the back of her right leg. She denies any redness or warmth in the area. She has a history of low back pain. She reports stable home situation at this time and knows to call for help as needed. She still plans to deliver at [facility] and understands the recommendation to establish care with them.  Contractions: Not present. Vag. Bleeding: None.  Movement: Present. Leaking Fluid denies.  ----------------------------------------------------------------------------------- The following portions of the patient's history were reviewed and updated as appropriate: allergies, current medications, past family history, past medical history, past social history, past surgical history and problem list. Problem list updated.  Objective  Blood pressure 134/69, weight 170 lb (77.1 kg), last menstrual period 01/07/2020 Pregravid weight 161 lb (73 kg) Total Weight Gain 9 lb (4.082 kg) Urinalysis: Urine Protein    Urine Glucose    Fetal Status: Fetal Heart Rate (bpm): 138 Fundal Height: 20 cm Movement: Present     General:  Alert, oriented and cooperative. Patient is in no acute distress.  Skin: Skin is warm and dry. No rash noted.   Cardiovascular: Normal heart rate noted  Respiratory: Normal respiratory effort, no problems with respiration noted   Abdomen: Soft, gravid, appropriate for gestational age. Pain/Pressure: Absent     Pelvic:  Cervical exam deferred        Extremities: No redness or warmth, tender to palpation behind right knee Normal range of motion.     Mental Status: Normal mood and affect. Normal behavior. Normal judgment and thought content.   Assessment   24 y.o. [id number] at [redacted]w[redacted]d by  10/21/2020, by Ultrasound presenting for work-in prenatal visit  Plan   Pregnancy#4 Problems (from 01/07/20 to present)    Problem Noted Resolved   Supervision of other normal pregnancy, antepartum 03/13/2020 by Malachy Mood, MD No   Overview Addendum 05/26/2020  7:09 PM by Imagene Riches, [place] Prenatal Labs  Dating 7 week 3 day ER [place] Blood type: A/Positive/-- (04/26 1011)   Genetic Screen SMA neg, Fragile-X neg, CF neg   NIPS: Antibody:Negative (04/26 1011)  Anatomic [place]   Normal anatomy scan Gender reveal Rubella: 3.96 (04/26 1011) Varicella: Immune  GTT Early:               Third trimester:  RPR: Non Reactive (04/26 1011)   Rhogam N/A HBsAg: Negative (04/26 1011)   TDaP vaccine                       Flu Shot: HIV: Non Reactive (04/26 1011)   Baby Food                                GBS:   Contraception  Pap: NILM 03/13/2020  CBB   Pelvis tested to 7lbs 7oz  CS/VBAC Desires TOLAC   Support Person  Previous Version   History of cesarean section 03/13/2020 by Malachy Mood, MD No   Overview Signed 03/13/2020  1:24 PM by Malachy Mood, MD    For breech at [place]      Vaginal bleeding in pregnancy  by Oralia Rud, MD No    Lower extremity swelling: increase hydration, elevate legs as needed Back pain/numbness in leg: heat/ice, stretching/exercise, abdominal support band, epsom salt soak  Preterm labor symptoms and general obstetric precautions including but not limited to vaginal bleeding, contractions, leaking of fluid and fetal movement were reviewed in detail with the  patient. Please refer to After Visit Summary for other counseling recommendations.   Return for scheduled follow up.  Rod Can, CNM 06/07/2020 11:39 AM

## 2020-06-21 ENCOUNTER — Encounter: Payer: Self-pay | Admitting: Obstetrics & Gynecology

## 2020-06-21 ENCOUNTER — Other Ambulatory Visit: Payer: Self-pay

## 2020-06-21 ENCOUNTER — Ambulatory Visit (INDEPENDENT_AMBULATORY_CARE_PROVIDER_SITE_OTHER): Payer: Medicaid Other | Admitting: Obstetrics & Gynecology

## 2020-06-21 VITALS — BP 100/60 | Wt 171.0 lb

## 2020-06-21 DIAGNOSIS — Z3482 Encounter for supervision of other normal pregnancy, second trimester: Secondary | ICD-10-CM

## 2020-06-21 DIAGNOSIS — Z3A22 22 weeks gestation of pregnancy: Secondary | ICD-10-CM

## 2020-06-21 DIAGNOSIS — Z131 Encounter for screening for diabetes mellitus: Secondary | ICD-10-CM

## 2020-06-21 DIAGNOSIS — Z98891 History of uterine scar from previous surgery: Secondary | ICD-10-CM

## 2020-06-21 NOTE — Progress Notes (Signed)
Subjective  Fetal Movement? yes Contractions? no Leaking Fluid? no Vaginal Bleeding? no Desires VBAC, counseled as such  Objective  BP 100/60   Wt 171 lb (77.6 kg)   LMP 01/07/2020 (Approximate)   BMI 26.78 kg/m  General: NAD Pumonary: no increased work of breathing Abdomen: gravid, non-tender Extremities: no edema Psychiatric: mood appropriate, affect full  Assessment  24 y.o. [id number] at [redacted]w[redacted]d by  10/21/2020, by Ultrasound presenting for routine prenatal visit  Plan   Problem List Items Addressed This Visit      Other   Supervision of other normal pregnancy, antepartum   History of cesarean section    Other Visit Diagnoses    [redacted] weeks gestation of pregnancy    -  Primary   Screening for diabetes mellitus       Relevant Orders   28 Week RH+Panel      Pregnancy#4 Problems (from 01/07/20 to present)    Problem Noted Resolved   Supervision of other normal pregnancy, antepartum 03/13/2020 by Malachy Mood, MD No   Overview Addendum 05/26/2020  7:09 PM by Imagene Riches, [place] Prenatal Labs  Dating 7 week 3 day ER [place] Blood type: A/Positive/-- (04/26 1011)   Genetic Screen SMA neg, Fragile-X neg, CF neg   NIPS: Antibody:Negative (04/26 1011)  Anatomic [place]   Normal anatomy scan Gender reveal Rubella: 3.96 (04/26 1011) Varicella: Immune  GTT Early:               Third trimester:  RPR: Non Reactive (04/26 1011)   Rhogam N/A HBsAg: Negative (04/26 1011)   TDaP vaccine                       Flu Shot: HIV: Non Reactive (04/26 1011)   Baby Food                                GBS:   Contraception  Pap: NILM 03/13/2020  CBB   Pelvis tested to 7lbs 7oz  CS/VBAC Desires TOLAC   Support Person            Previous Version   History of cesarean section 03/13/2020 by Malachy Mood, MD No   Overview Signed 03/13/2020  1:24 PM by Malachy Mood, MD    For breech at [place]      Vaginal bleeding in pregnancy  by Oralia Rud, MD No     OB/GYN   Counseling Note  24 y.o. [phone] at [redacted]w[redacted]d with Estimated Date of Delivery: 10/21/20 was seen today in office to discuss trial of labor after cesarean section (TOLAC) versus elective repeat cesarean delivery (ERCD). The following risks were discussed with the patient.  Risk of uterine rupture at term is 0.78 percent with TOLAC and 0.22 percent with ERCD. 1 in 10 uterine ruptures will result in neonatal death or neurological injury. The benefits of a trial of labor after cesarean (TOLAC) resulting in a vaginal birth after cesarean (VBAC) include the following: shorter length of hospital stay and postpartum recovery (in most cases); fewer complications, such as postpartum fever, wound or uterine infection, thromboembolism (blood clots in the leg or lung), need for blood transfusion and fewer neonatal breathing problems.  The risks of an attempted VBAC or TOLAC include the following: Risk of failed trial of labor after cesarean (TOLAC) without a vaginal birth after cesarean (VBAC) resulting in repeat cesarean  delivery (RCD) in about 20 to 58 percent of women who attempt VBAC.  Risk of rupture of uterus resulting in an emergency cesarean delivery. The risk of uterine rupture may be related in part to the type of uterine incision made during the first cesarean delivery. A previous transverse uterine incision has the lowest risk of rupture (0.2 to 1.5 percent risk). Vertical or T-shaped uterine incisions have a higher risk of uterine rupture (4 to 9 percent risk)The risk of fetal death is very low with both VBAC and elective repeat cesarean delivery (ERCD), but the likelihood of fetal death is higher with VBAC than with ERCD. Maternal death is very rare with either type of delivery.  The risks of an elective repeat cesarean delivery (ERCD) were reviewed with the patient including but not limited to: 12/998 risk of uterine rupture which could have serious consequences, bleeding which may require transfusion;  infection which may require antibiotics; injury to bowel, bladder or other surrounding organs (bowel, bladder, ureters); injury to the fetus; need for additional procedures including hysterectomy in the event of a life-threatening hemorrhage; thromboembolic phenomenon; abnormal placentation; incisional problems; death and other postoperative or anesthesia complications.    Desires VBAC  [place]  Barnett Applebaum, MD, [facility], [facility] 06/21/2020  4:35 PM

## 2020-06-21 NOTE — Patient Instructions (Signed)

## 2020-07-14 ENCOUNTER — Telehealth: Payer: Self-pay

## 2020-07-14 NOTE — Telephone Encounter (Signed)
Patient inquiring how to know if she has covid. Cb#[phone]

## 2020-07-14 NOTE — Telephone Encounter (Signed)
Spoke w/patient. She has body aches, headache, cough & sore throat. Advised needs to be tested. Can contact PCP for information on recommended testing site.

## 2020-07-18 ENCOUNTER — Emergency Department
Admission: EM | Admit: 2020-07-18 | Discharge: 2020-07-18 | Disposition: A | Discharge status: Home / Self Care | Payer: Medicaid Other | Attending: Emergency Medicine | Admitting: Emergency Medicine

## 2020-07-18 ENCOUNTER — Emergency Department: Payer: Medicaid Other

## 2020-07-18 ENCOUNTER — Other Ambulatory Visit: Payer: Self-pay

## 2020-07-18 ENCOUNTER — Telehealth: Payer: Self-pay

## 2020-07-18 DIAGNOSIS — R0602 Shortness of breath: Secondary | ICD-10-CM | POA: Insufficient documentation

## 2020-07-18 DIAGNOSIS — Z3A23 23 weeks gestation of pregnancy: Secondary | ICD-10-CM | POA: Insufficient documentation

## 2020-07-18 DIAGNOSIS — R109 Unspecified abdominal pain: Secondary | ICD-10-CM | POA: Insufficient documentation

## 2020-07-18 DIAGNOSIS — Z5321 Procedure and treatment not carried out due to patient leaving prior to being seen by health care provider: Secondary | ICD-10-CM | POA: Insufficient documentation

## 2020-07-18 DIAGNOSIS — R05 Cough: Secondary | ICD-10-CM | POA: Insufficient documentation

## 2020-07-18 DIAGNOSIS — O26892 Other specified pregnancy related conditions, second trimester: Secondary | ICD-10-CM | POA: Diagnosis present

## 2020-07-18 DIAGNOSIS — R509 Fever, unspecified: Secondary | ICD-10-CM | POA: Diagnosis not present

## 2020-07-18 LAB — CBC WITH DIFFERENTIAL/PLATELET
Abs Immature Granulocytes: 0.15 10*3/uL — ABNORMAL HIGH (ref 0.00–0.07)
Basophils Absolute: 0 10*3/uL (ref 0.0–0.1)
Basophils Relative: 0 %
Eosinophils Absolute: 0 10*3/uL (ref 0.0–0.5)
Eosinophils Relative: 0 %
HCT: 31.7 % — ABNORMAL LOW (ref 36.0–46.0)
Hemoglobin: 10.3 g/dL — ABNORMAL LOW (ref 12.0–15.0)
Immature Granulocytes: 2 %
Lymphocytes Relative: 7 %
Lymphs Abs: 0.5 10*3/uL — ABNORMAL LOW (ref 0.7–4.0)
MCH: 28.5 pg (ref 26.0–34.0)
MCHC: 32.5 g/dL (ref 30.0–36.0)
MCV: 87.6 fL (ref 80.0–100.0)
Monocytes Absolute: 0.5 10*3/uL (ref 0.1–1.0)
Monocytes Relative: 6 %
Neutro Abs: 6.6 10*3/uL (ref 1.7–7.7)
Neutrophils Relative %: 85 %
Platelets: 118 10*3/uL — ABNORMAL LOW (ref 150–400)
RBC: 3.62 MIL/uL — ABNORMAL LOW (ref 3.87–5.11)
RDW: 13.2 % (ref 11.5–15.5)
WBC: 7.8 10*3/uL (ref 4.0–10.5)
nRBC: 0 % (ref 0.0–0.2)

## 2020-07-18 LAB — BASIC METABOLIC PANEL
Anion gap: 11 (ref 5–15)
BUN: 6 mg/dL (ref 6–20)
CO2: 21 mmol/L — ABNORMAL LOW (ref 22–32)
Calcium: 8.2 mg/dL — ABNORMAL LOW (ref 8.9–10.3)
Chloride: 103 mmol/L (ref 98–111)
Creatinine, Ser: 0.53 mg/dL (ref 0.44–1.00)
GFR calc Af Amer: >60 mL/min (ref >60)
GFR calc non Af Amer: >60 mL/min (ref >60)
Glucose, Bld: 87 mg/dL (ref 70–99)
Potassium: 3.4 mmol/L — ABNORMAL LOW (ref 3.5–5.1)
Sodium: 135 mmol/L (ref 135–145)

## 2020-07-18 MED ORDER — ACETAMINOPHEN 325 MG PO TABS
ORAL_TABLET | ORAL | Status: AC
  Filled 2020-07-18: qty 2

## 2020-07-18 MED ORDER — ACETAMINOPHEN 325 MG PO TABS
650.0000 mg | ORAL_TABLET | Freq: Once | ORAL | Status: AC | PRN
  Administered 2020-07-18: 650 mg via ORAL

## 2020-07-18 NOTE — ED Triage Notes (Signed)
Pt states she is [redacted] weeks pregnant and having abd pain/contractions with cough, fever, SOB since Thursday, states she is having N/V and is not able to keep anything down, just received her covid+ results today.

## 2020-07-18 NOTE — Telephone Encounter (Signed)
Pt calling to see what she can take for congestion and cough; 27wks.  [place]

## 2020-07-18 NOTE — Telephone Encounter (Signed)
Pt states has tested positive for covid. Adv to tx sxs individually.  For congestion plain sudafed, for cough Hall's cough drops, plain robitussin/plain musinex; for aches, pains, fever e.s. tylenol.  Pt wanted to know at what point does she go to the hosp - has taken two doses of tylenol and it hasn't touched fever. Currently her fever is 102.0  (NOT 100.2).  Adv pt to call L&D to see what there policy is about this.  Number given.

## 2020-07-21 ENCOUNTER — Other Ambulatory Visit: Payer: Self-pay

## 2020-07-21 ENCOUNTER — Emergency Department: Payer: Medicaid Other

## 2020-07-21 DIAGNOSIS — Z3A00 Weeks of gestation of pregnancy not specified: Secondary | ICD-10-CM | POA: Insufficient documentation

## 2020-07-21 DIAGNOSIS — U071 COVID-19: Secondary | ICD-10-CM | POA: Insufficient documentation

## 2020-07-21 DIAGNOSIS — O98519 Other viral diseases complicating pregnancy, unspecified trimester: Secondary | ICD-10-CM | POA: Diagnosis present

## 2020-07-21 LAB — COMPREHENSIVE METABOLIC PANEL
ALT: 13 U/L (ref 0–44)
AST: 16 U/L (ref 15–41)
Albumin: 2.8 g/dL — ABNORMAL LOW (ref 3.5–5.0)
Alkaline Phosphatase: 64 U/L (ref 38–126)
Anion gap: 10 (ref 5–15)
BUN: 5 mg/dL — ABNORMAL LOW (ref 6–20)
CO2: 23 mmol/L (ref 22–32)
Calcium: 8.1 mg/dL — ABNORMAL LOW (ref 8.9–10.3)
Chloride: 102 mmol/L (ref 98–111)
Creatinine, Ser: 0.51 mg/dL (ref 0.44–1.00)
GFR calc Af Amer: >60 mL/min (ref >60)
GFR calc non Af Amer: >60 mL/min (ref >60)
Glucose, Bld: 88 mg/dL (ref 70–99)
Potassium: 3 mmol/L — ABNORMAL LOW (ref 3.5–5.1)
Sodium: 135 mmol/L (ref 135–145)
Total Bilirubin: 0.7 mg/dL (ref 0.3–1.2)
Total Protein: 7 g/dL (ref 6.5–8.1)

## 2020-07-21 LAB — LACTIC ACID, PLASMA: Lactic Acid, Venous: 0.8 mmol/L (ref 0.5–1.9)

## 2020-07-21 LAB — CBC
HCT: 32.1 % — ABNORMAL LOW (ref 36.0–46.0)
Hemoglobin: 10.4 g/dL — ABNORMAL LOW (ref 12.0–15.0)
MCH: 28.2 pg (ref 26.0–34.0)
MCHC: 32.4 g/dL (ref 30.0–36.0)
MCV: 87 fL (ref 80.0–100.0)
Platelets: 106 10*3/uL — ABNORMAL LOW (ref 150–400)
RBC: 3.69 MIL/uL — ABNORMAL LOW (ref 3.87–5.11)
RDW: 13.2 % (ref 11.5–15.5)
WBC: 7.9 10*3/uL (ref 4.0–10.5)
nRBC: 0 % (ref 0.0–0.2)

## 2020-07-21 LAB — TROPONIN I (HIGH SENSITIVITY): Troponin I (High Sensitivity): 7 ng/L (ref <18)

## 2020-07-21 NOTE — ED Triage Notes (Addendum)
First nurse note: Tightness in her chest, someone sitting on her chest, resp 21-30, 95% RA, end tidal 26-29, 100.2 temp orally, 102/48, sinus tach at 116.  Started feeling bad last thursday, covid test on last Saturday and Tuesday received the result and that 's the day she started feeling bad  Pt is approx 26 weeks preg

## 2020-07-21 NOTE — ED Triage Notes (Signed)
Patient c/o SOB, cough, and chest tightness beginning Tuesday.  Patient reports she is Covid positive at [place].

## 2020-07-22 ENCOUNTER — Emergency Department
Admission: EM | Admit: 2020-07-22 | Discharge: 2020-07-22 | Disposition: A | Discharge status: Home / Self Care | Payer: Medicaid Other | Attending: Emergency Medicine | Admitting: Emergency Medicine

## 2020-07-22 DIAGNOSIS — U071 COVID-19: Secondary | ICD-10-CM

## 2020-07-22 MED ORDER — ALBUTEROL SULFATE HFA 108 (90 BASE) MCG/ACT IN AERS
2.0000 | INHALATION_SPRAY | Freq: Four times a day (QID) | RESPIRATORY_TRACT | 0 refills | Status: DC | PRN
Start: 2020-07-22 — End: 2024-04-05

## 2020-07-22 NOTE — ED Notes (Signed)
E-signature not working at this time. Pt verbalized understanding of D/C instructions, prescriptions and follow up care with no further questions at this time. Pt in NAD and ambulatory at time of D/C.  

## 2020-07-22 NOTE — Discharge Instructions (Signed)
Please seek medical attention for any high fevers, chest pain, shortness of breath, change in behavior, persistent vomiting, bloody stool or any other new or concerning symptoms.  
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
## 2020-07-26 ENCOUNTER — Other Ambulatory Visit: Payer: Self-pay

## 2020-07-26 ENCOUNTER — Encounter: Payer: Medicaid Other | Admitting: Advanced Practice Midwife

## 2020-08-16 ENCOUNTER — Other Ambulatory Visit: Payer: Self-pay | Admitting: Obstetrics & Gynecology

## 2020-08-16 DIAGNOSIS — Z131 Encounter for screening for diabetes mellitus: Secondary | ICD-10-CM

## 2020-12-29 IMAGING — US US OB < 14 WEEKS - US OB TV
1 series · 14 of 28 positions shown · non-contrast
Comparison: None.

CLINICAL DATA: Spotting for 1 day

EXAM:
OBSTETRIC <14 WK US AND TRANSVAGINAL OB US
TECHNIQUE: Both transabdominal and transvaginal ultrasound examinations were
performed for complete evaluation of the gestation as well as the
maternal uterus, adnexal regions, and pelvic cul-de-sac.
Transvaginal technique was performed to assess early pregnancy.

[Series 1: us ob less than 14 weeks with ob transvaginal · 14 of 91 slices shown]
[im 4/91]
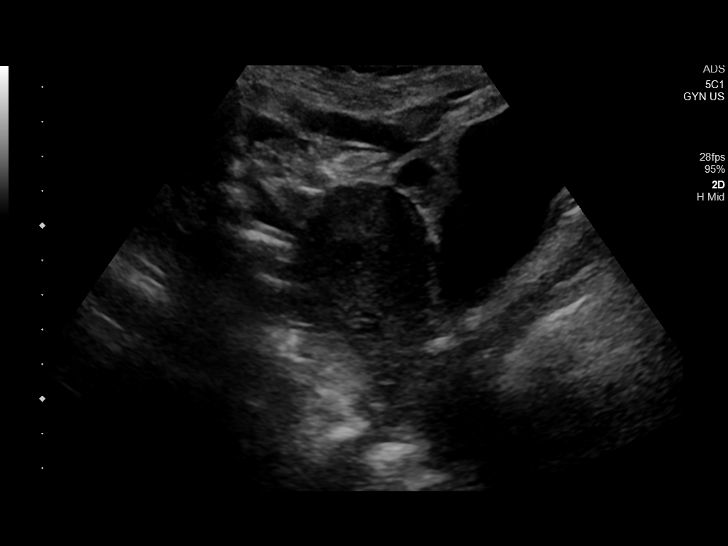
[im 11/91]
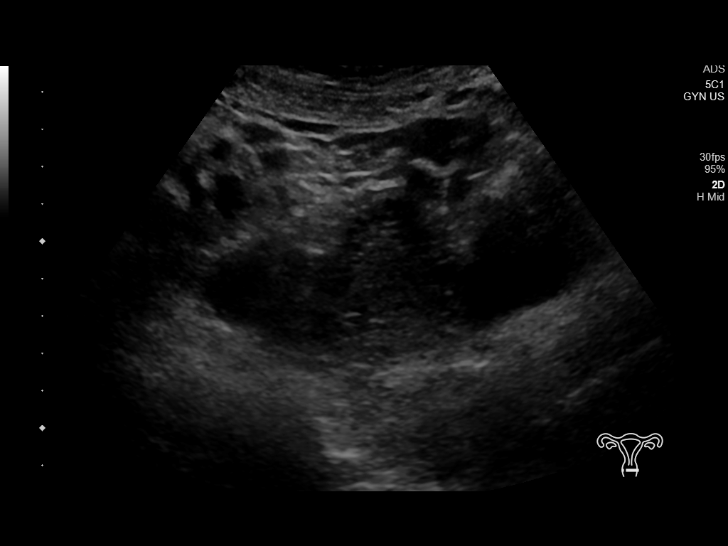
[im 17/91]
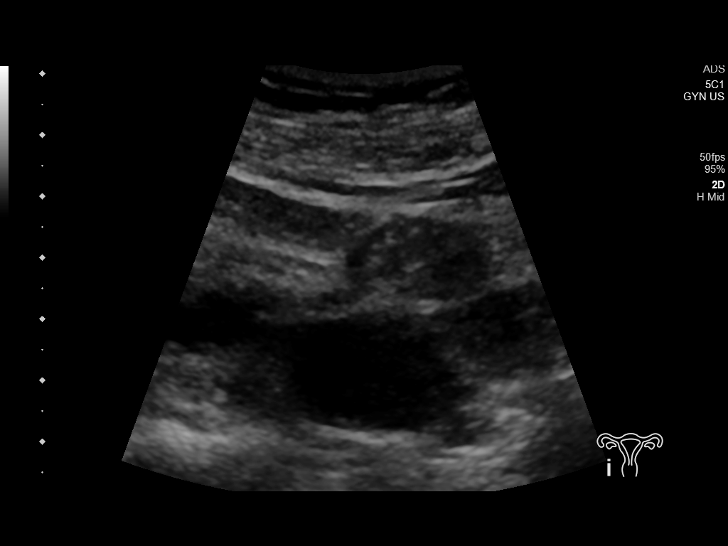
[im 24/91]
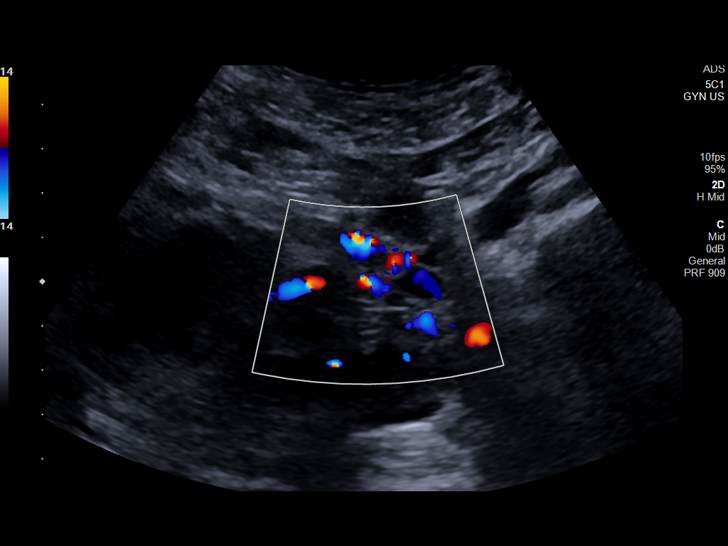
[im 31/91]
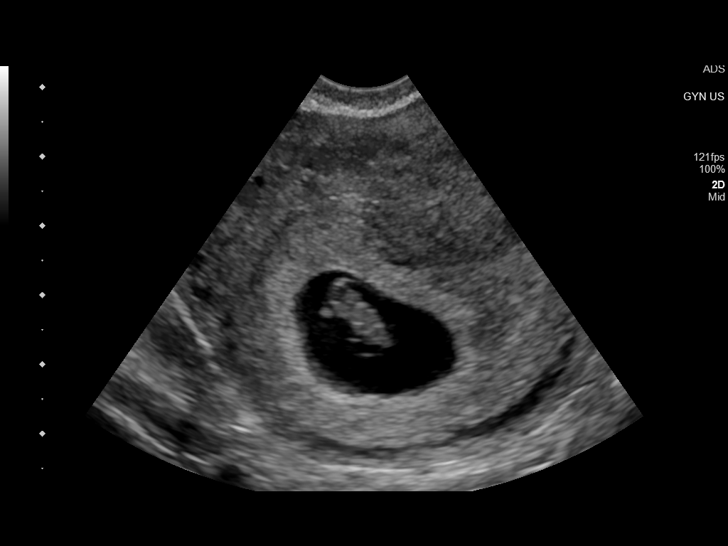
[im 37/91]
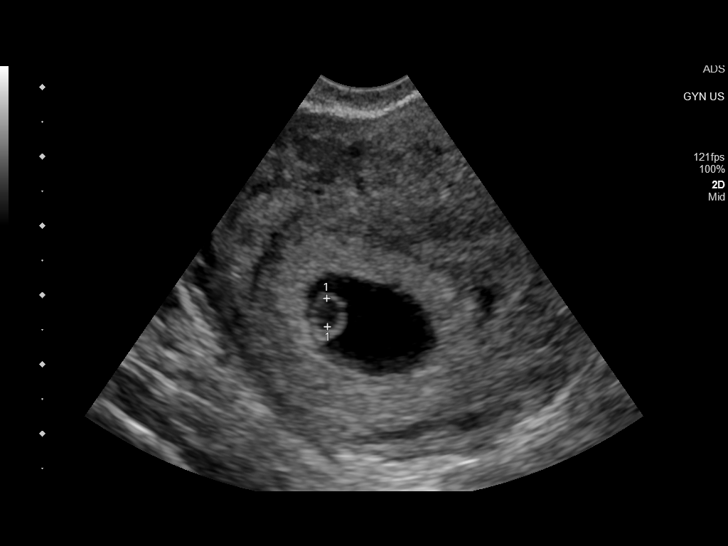
[im 44/91]
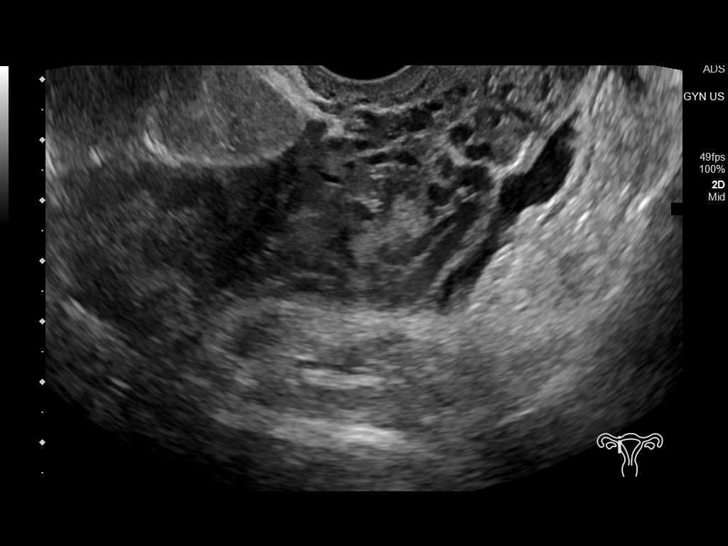
[im 51/91]
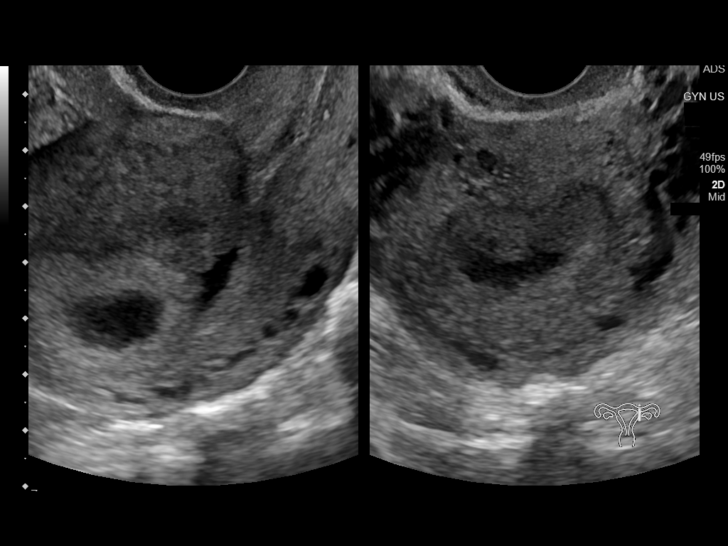
[im 57/91]
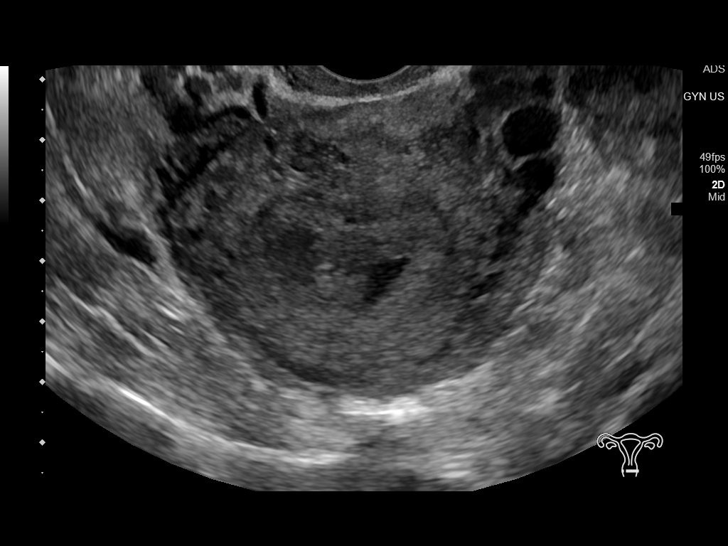
[im 64/91]
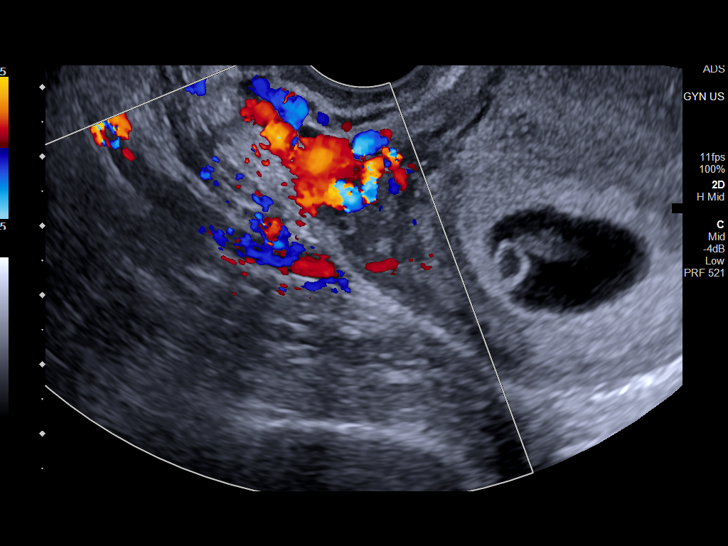
[im 71/91]
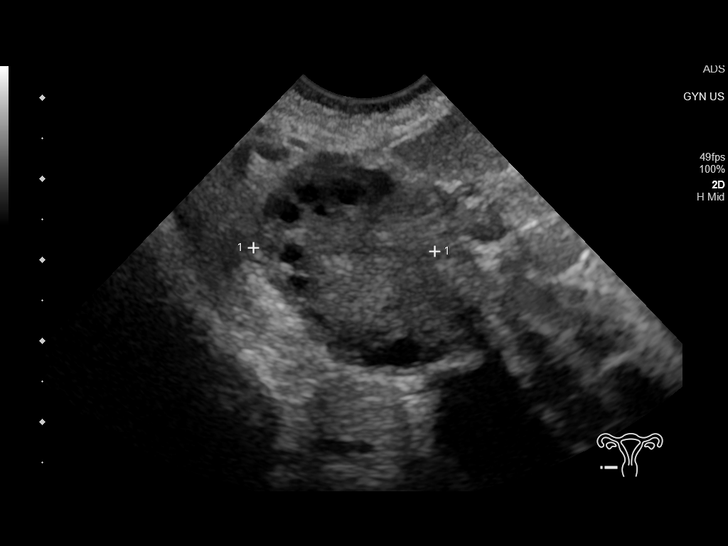
[im 77/91]
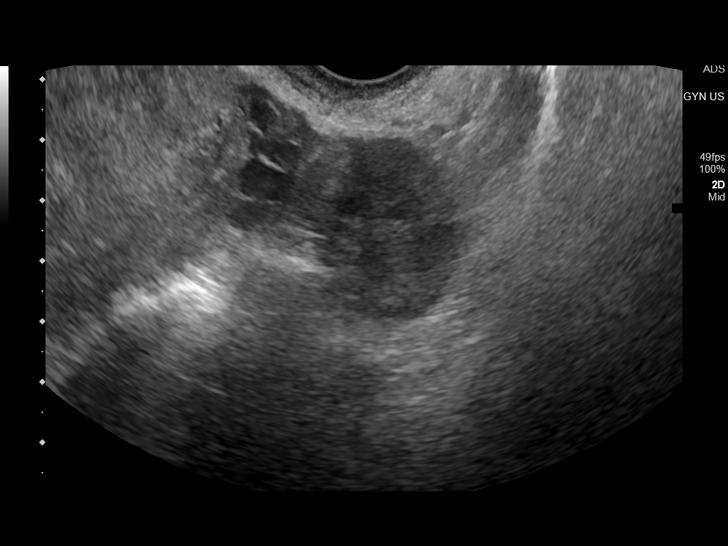
[im 84/91]
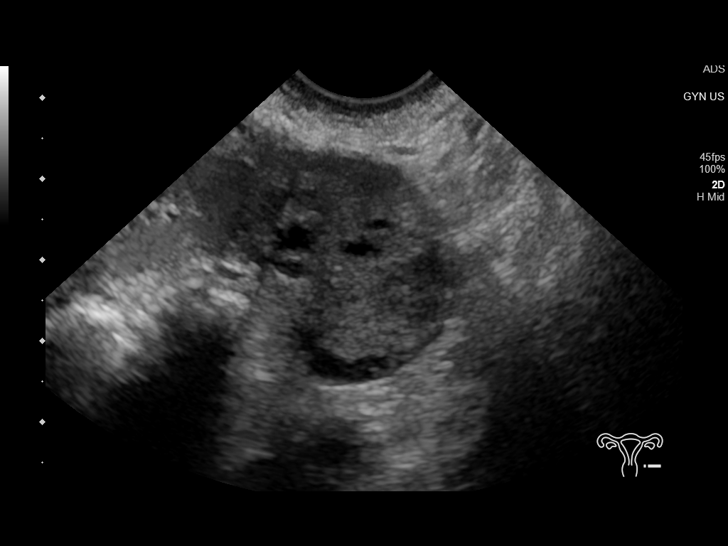
[im 91/91]
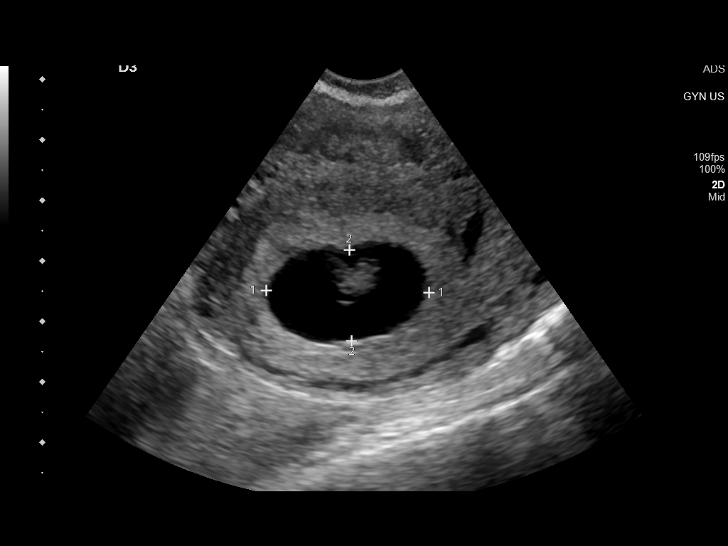

[14 of 28 positions shown; findings below may reference images not displayed]

FINDINGS: Intrauterine gestational sac: Single

Yolk sac:  Visualized.

Embryo:  Visualized.

Cardiac Activity: Visualized.

Heart Rate: 145 bpm

CRL: 12.3 mm   7 w   3 d                  US EDC: 10/21/2020

Subchorionic hemorrhage: Small subchorionic hemorrhage along the
inferior sac.

Maternal uterus/adnexae: Ovaries are within normal limits. Left
ovary measures 2.3 by 2.3 x 2.9 cm. The right ovary measures 2.4 x
2.6 x 2 cm. Trace free fluid.
IMPRESSION: 1. Single viable intrauterine pregnancy as above.
2. Small subchorionic hemorrhage
3. Trace free fluid in the pelvis

## 2021-02-06 ENCOUNTER — Other Ambulatory Visit: Payer: Self-pay | Admitting: Family Medicine

## 2021-03-07 ENCOUNTER — Other Ambulatory Visit: Payer: Self-pay | Admitting: Family Medicine

## 2021-03-07 DIAGNOSIS — D249 Benign neoplasm of unspecified breast: Secondary | ICD-10-CM

## 2021-03-09 ENCOUNTER — Ambulatory Visit
Admission: RE | Admit: 2021-03-09 | Discharge: 2021-03-09 | Disposition: A | Discharge status: Home / Self Care | Payer: Medicaid Other | Source: Ambulatory Visit | Attending: Family Medicine | Admitting: Family Medicine

## 2021-03-09 ENCOUNTER — Other Ambulatory Visit: Payer: Self-pay

## 2021-03-09 DIAGNOSIS — D249 Benign neoplasm of unspecified breast: Secondary | ICD-10-CM

## 2021-04-12 ENCOUNTER — Ambulatory Visit
Admission: RE | Admit: 2021-04-12 | Discharge: 2021-04-12 | Disposition: A | Discharge status: Home / Self Care | Payer: Medicaid Other | Source: Ambulatory Visit | Attending: Family Medicine | Admitting: Family Medicine

## 2021-04-12 ENCOUNTER — Other Ambulatory Visit: Payer: Self-pay

## 2021-04-12 VITALS — BP 104/55 | HR 65 | Temp 98.8°F | Resp 18 | Ht 67.0 in | Wt 175.0 lb

## 2021-04-12 DIAGNOSIS — J029 Acute pharyngitis, unspecified: Secondary | ICD-10-CM

## 2021-04-12 LAB — GROUP A STREP BY PCR: Group A Strep by PCR: NOT DETECTED

## 2021-04-12 MED ORDER — AZITHROMYCIN 250 MG PO TABS: ORAL_TABLET | ORAL | 0 refills | Status: DC
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
## 2021-04-12 NOTE — ED Triage Notes (Signed)
Pt c/o sore throat. Started about 3 days ago. She states she had a fever the first day but none since. Denies other URI symptoms.

## 2021-04-25 ENCOUNTER — Encounter: Payer: Self-pay | Admitting: Obstetrics and Gynecology

## 2021-05-09 ENCOUNTER — Encounter: Payer: Self-pay | Admitting: Obstetrics and Gynecology

## 2021-05-23 ENCOUNTER — Encounter: Payer: Medicaid Other | Admitting: Obstetrics and Gynecology

## 2021-06-01 ENCOUNTER — Encounter: Payer: Self-pay | Admitting: Obstetrics and Gynecology

## 2021-06-19 ENCOUNTER — Encounter: Payer: Medicaid Other | Admitting: Obstetrics and Gynecology

## 2021-06-27 ENCOUNTER — Encounter: Payer: Self-pay | Admitting: Obstetrics and Gynecology

## 2021-07-13 ENCOUNTER — Other Ambulatory Visit: Payer: Self-pay

## 2021-07-13 ENCOUNTER — Encounter: Payer: Self-pay | Admitting: Emergency Medicine

## 2021-07-13 ENCOUNTER — Ambulatory Visit
Admission: EM | Admit: 2021-07-13 | Discharge: 2021-07-13 | Disposition: A | Discharge status: Home / Self Care | Payer: Medicaid Other | Attending: Family Medicine | Admitting: Family Medicine

## 2021-07-13 DIAGNOSIS — B9689 Other specified bacterial agents as the cause of diseases classified elsewhere: Secondary | ICD-10-CM | POA: Insufficient documentation

## 2021-07-13 DIAGNOSIS — N760 Acute vaginitis: Secondary | ICD-10-CM | POA: Insufficient documentation

## 2021-07-13 DIAGNOSIS — B3731 Acute candidiasis of vulva and vagina: Secondary | ICD-10-CM

## 2021-07-13 DIAGNOSIS — B373 Candidiasis of vulva and vagina: Secondary | ICD-10-CM | POA: Insufficient documentation

## 2021-07-13 LAB — URINALYSIS, COMPLETE (UACMP) WITH MICROSCOPIC
Bilirubin Urine: NEGATIVE
Glucose, UA: NEGATIVE mg/dL
Hgb urine dipstick: NEGATIVE
Ketones, ur: NEGATIVE mg/dL
Nitrite: NEGATIVE
Protein, ur: NEGATIVE mg/dL
Specific Gravity, Urine: >1.03 — ABNORMAL HIGH (ref 1.005–1.030)
pH: 5 (ref 5.0–8.0)

## 2021-07-13 LAB — WET PREP, GENITAL
Sperm: NONE SEEN
Trich, Wet Prep: NONE SEEN

## 2021-07-13 MED ORDER — FLUCONAZOLE 150 MG PO TABS: 150.0000 mg | ORAL_TABLET | Freq: Once | ORAL | 0 refills | Status: AC

## 2021-07-13 MED ORDER — METRONIDAZOLE 500 MG PO TABS: 500.0000 mg | ORAL_TABLET | Freq: Two times a day (BID) | ORAL | 0 refills | Status: DC
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
## 2021-07-13 NOTE — ED Triage Notes (Signed)
Patient c/o vaginal itching and discomfort for the past 4 days.  Patient wants to be tested for STDs.

## 2021-07-14 LAB — CHLAMYDIA/NGC RT PCR (ARMC ONLY)
Chlamydia Tr: NOT DETECTED
N gonorrhoeae: NOT DETECTED

## 2021-12-31 IMAGING — US US BREAST*L* LIMITED INC AXILLA
1 series · 2 of 2 positions shown · non-contrast
Comparison: None.

CLINICAL DATA: Patient presents for palpable abnormalities within
the left and right breast.

EXAM:
ULTRASOUND OF THE BILATERAL BREAST

[Series 1: us breast*left* limited inc axilla · 0.06mm/px · 2 of 2 slices shown]
[im 1/2]
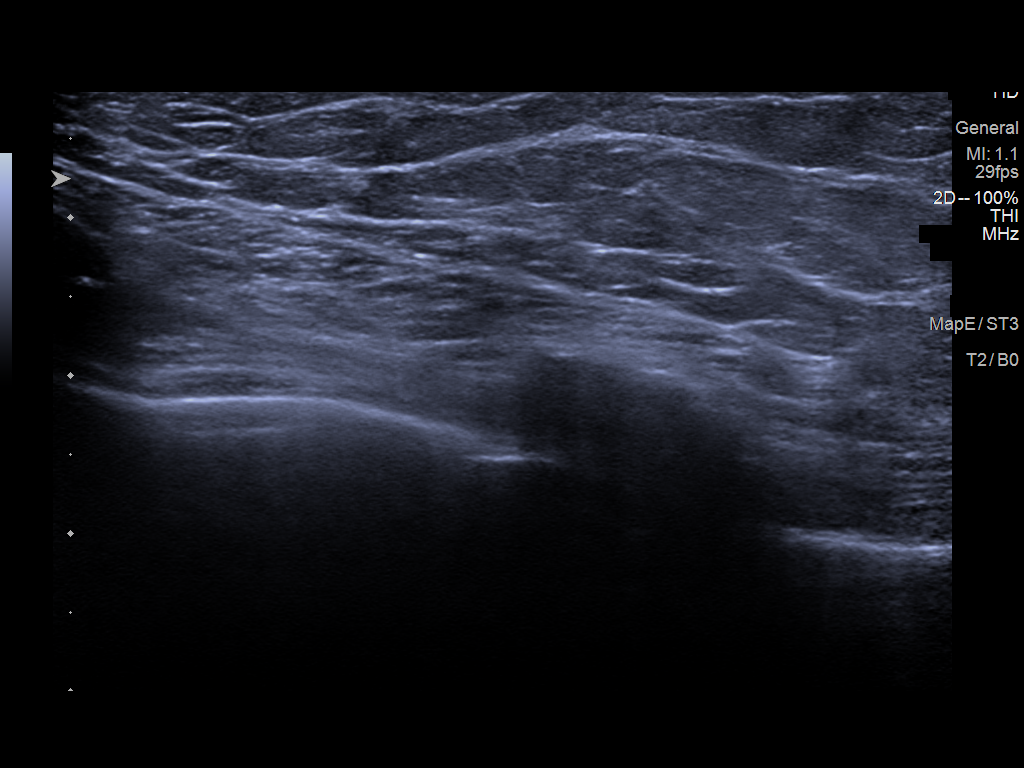
[im 2/2]
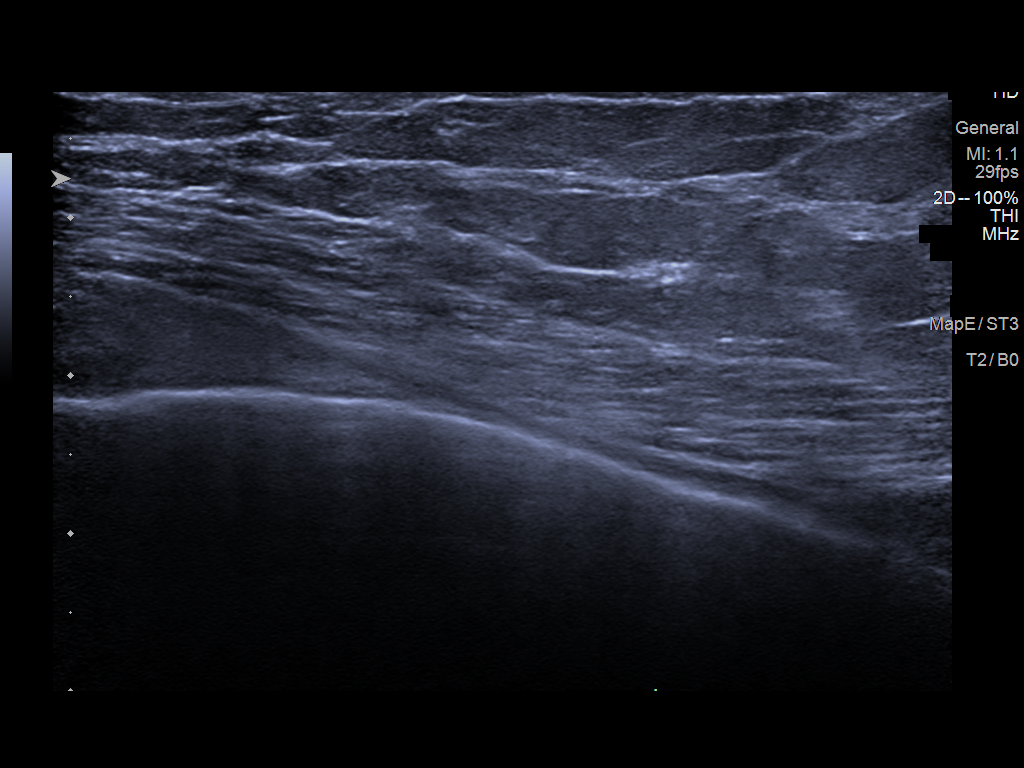

[2 of 2 positions shown; findings below may reference images not displayed]

FINDINGS: On physical exam, dense tissue is palpated within the 11 o'clock
position left breast. Dense tissue is palpated within the lower
outer right breast.

Targeted ultrasound is performed, showing normal dense tissue
without suspicious mass within the left breast 11 o'clock position 8
cm from nipple the site of palpable concern.

Additionally, dense tissue without suspicious mass is identified
within the lower outer right breast at the site of palpable concern.
IMPRESSION: No suspicious abnormality within the left or right breast at the
site of palpable concern.

RECOMMENDATION:
Continued clinical evaluation for bilateral palpable areas of
concern.

Screening mammogram at age 40 unless there are persistent or
intervening clinical concerns. (Code:WV-C-3LV)

I have discussed the findings and recommendations with the patient.
If applicable, a reminder letter will be sent to the patient
regarding the next appointment.

BI-RADS CATEGORY  2: Benign.

## 2021-12-31 IMAGING — US US BREAST*R* LIMITED INC AXILLA
1 series · 3 of 3 positions shown · non-contrast
Comparison: None.

CLINICAL DATA: Patient presents for palpable abnormalities within
the left and right breast.

EXAM:
ULTRASOUND OF THE BILATERAL BREAST

[Series 1: us breast*right* limited inc axilla · 0.07mm/px · 3 of 3 slices shown]
[im 1/3]
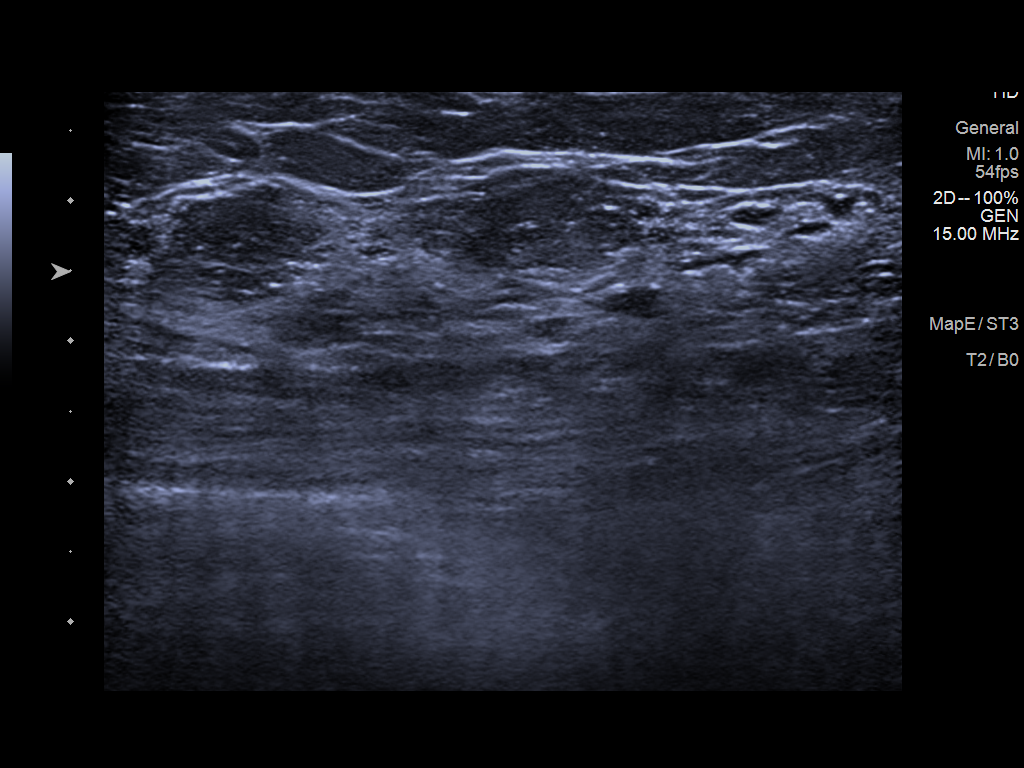
[im 2/3]
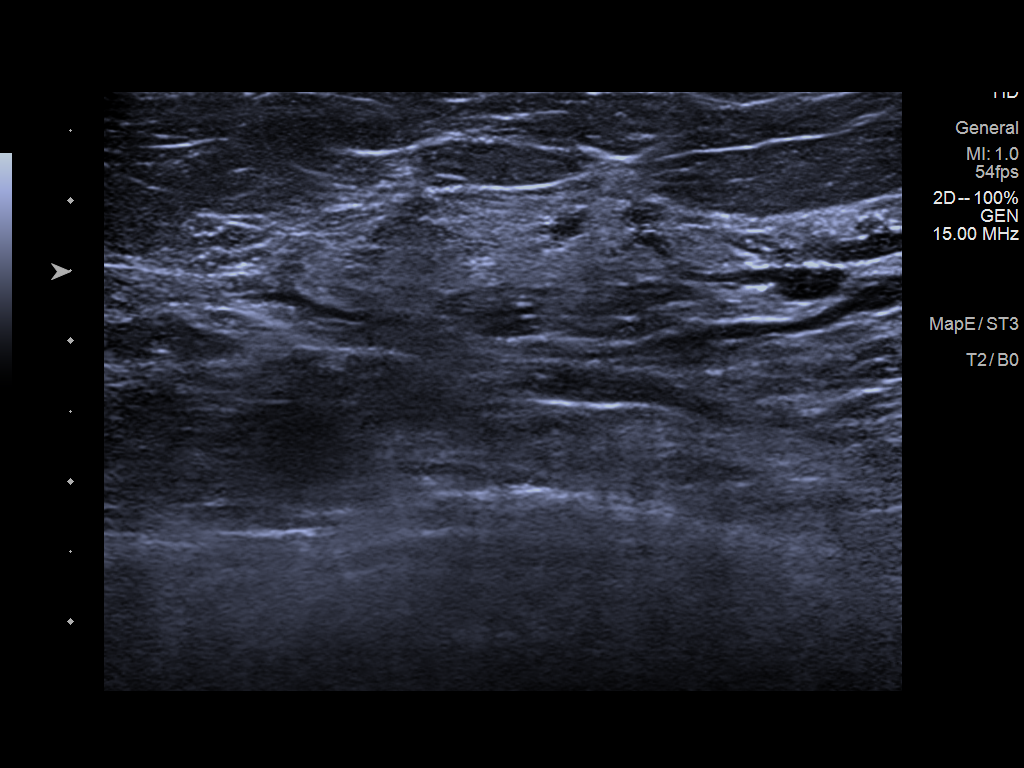
[im 3/3]
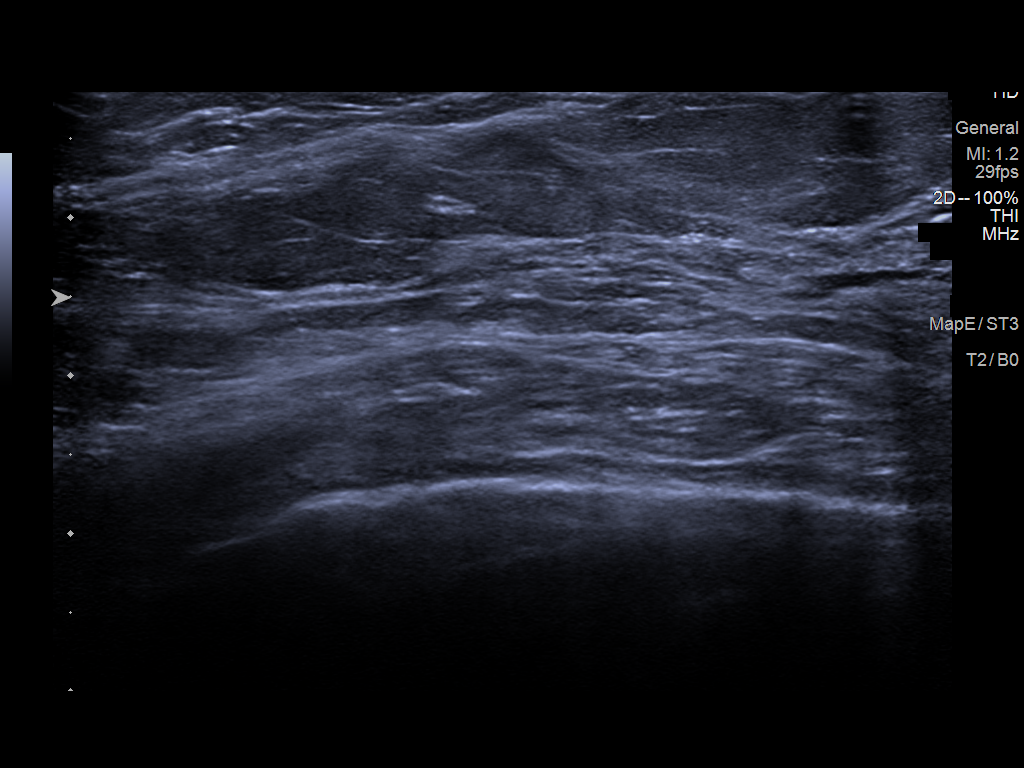

[3 of 3 positions shown; findings below may reference images not displayed]

FINDINGS: On physical exam, dense tissue is palpated within the 11 o'clock
position left breast. Dense tissue is palpated within the lower
outer right breast.

Targeted ultrasound is performed, showing normal dense tissue
without suspicious mass within the left breast 11 o'clock position 8
cm from nipple the site of palpable concern.

Additionally, dense tissue without suspicious mass is identified
within the lower outer right breast at the site of palpable concern.
IMPRESSION: No suspicious abnormality within the left or right breast at the
site of palpable concern.

RECOMMENDATION:
Continued clinical evaluation for bilateral palpable areas of
concern.

Screening mammogram at age 38 unless there are persistent or
intervening clinical concerns. (Code:WV-C-3LV)

I have discussed the findings and recommendations with the patient.
If applicable, a reminder letter will be sent to the patient
regarding the next appointment.

BI-RADS CATEGORY  2: Benign.

## 2022-01-20 ENCOUNTER — Ambulatory Visit: Payer: Self-pay

## 2022-09-19 DIAGNOSIS — N83209 Unspecified ovarian cyst, unspecified side: Secondary | ICD-10-CM | POA: Insufficient documentation

## 2022-09-19 DIAGNOSIS — G43909 Migraine, unspecified, not intractable, without status migrainosus: Secondary | ICD-10-CM | POA: Insufficient documentation

## 2022-09-19 DIAGNOSIS — R102 Pelvic and perineal pain: Secondary | ICD-10-CM | POA: Insufficient documentation

## 2023-05-07 ENCOUNTER — Ambulatory Visit (INDEPENDENT_AMBULATORY_CARE_PROVIDER_SITE_OTHER): Payer: BLUE CROSS/BLUE SHIELD

## 2023-05-07 ENCOUNTER — Ambulatory Visit (INDEPENDENT_AMBULATORY_CARE_PROVIDER_SITE_OTHER): Payer: BLUE CROSS/BLUE SHIELD | Admitting: Podiatry

## 2023-05-07 ENCOUNTER — Encounter: Payer: Self-pay | Admitting: Podiatry

## 2023-05-07 DIAGNOSIS — M7751 Other enthesopathy of right foot: Secondary | ICD-10-CM | POA: Diagnosis not present

## 2023-05-07 DIAGNOSIS — M7671 Peroneal tendinitis, right leg: Secondary | ICD-10-CM

## 2023-05-07 MED ORDER — MELOXICAM 15 MG PO TABS: 15.0000 mg | ORAL_TABLET | Freq: Every day | ORAL | 3 refills | Status: DC

## 2023-05-07 MED ORDER — METHYLPREDNISOLONE 4 MG PO TBPK: ORAL_TABLET | ORAL | 0 refills | Status: DC
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
## 2023-06-18 ENCOUNTER — Ambulatory Visit: Payer: BLUE CROSS/BLUE SHIELD | Admitting: Podiatry

## 2023-06-30 ENCOUNTER — Ambulatory Visit: Payer: BLUE CROSS/BLUE SHIELD | Admitting: Podiatry

## 2023-06-30 ENCOUNTER — Encounter: Payer: Self-pay | Admitting: Podiatry

## 2023-06-30 DIAGNOSIS — S86311A Strain of muscle(s) and tendon(s) of peroneal muscle group at lower leg level, right leg, initial encounter: Secondary | ICD-10-CM

## 2023-06-30 DIAGNOSIS — M216X1 Other acquired deformities of right foot: Secondary | ICD-10-CM

## 2023-06-30 NOTE — Progress Notes (Signed)
Jeanette Wright presents today for follow-up of her peroneal tendinitis she states that she took the medication and has worn the boot and really has not gotten any better she refers to her right foot.  She states that this is really starting to affect her ability to perform her duties keep active and to some degree her mental state.  She states that she would like to go ahead and take the boot off if is not going to help.  Objective: Vital signs are stable alert oriented x 3.  Pulses are palpable.  Still has swelling and nodularity along the peroneal tendons.  She has pain on palpation as well as abduction against resistance of the peroneals.  Assessment: Pain in limb secondary to peroneal tendon tear right cannot rule out ganglia or even tumorous processes.  Plan: Discussed etiology pathology and surgical therapies at this point I will allow her out of the boot and she will follow-up for the MRI which we are requesting due to inability to gain any pain relief with steroids nonsteroidals immobilization massage therapy physical therapy.

## 2023-07-16 ENCOUNTER — Encounter: Payer: Self-pay | Admitting: Podiatry

## 2023-07-19 ENCOUNTER — Other Ambulatory Visit: Payer: BLUE CROSS/BLUE SHIELD

## 2023-07-31 ENCOUNTER — Ambulatory Visit: Payer: BLUE CROSS/BLUE SHIELD

## 2023-08-01 ENCOUNTER — Telehealth: Payer: Self-pay | Admitting: Podiatry

## 2023-08-01 NOTE — Telephone Encounter (Signed)
Helmut Muster from [facility] called to update our office on a MRI placed for this patient.  She was tentatively scheduled for the 17th to have it performed.    [employer] (BC/BS) denied the MRI, so the test was cancelled.  The facility has made the patient aware of it.

## 2023-08-05 ENCOUNTER — Ambulatory Visit
Admission: EM | Admit: 2023-08-05 | Discharge: 2023-08-05 | Disposition: A | Discharge status: Home / Self Care | Payer: BLUE CROSS/BLUE SHIELD | Attending: Emergency Medicine | Admitting: Emergency Medicine

## 2023-08-05 ENCOUNTER — Other Ambulatory Visit: Payer: BLUE CROSS/BLUE SHIELD

## 2023-08-05 DIAGNOSIS — B370 Candidal stomatitis: Secondary | ICD-10-CM

## 2023-08-05 HISTORY — DX: Herpesviral infection of urogenital system, unspecified: A60.00

## 2023-08-05 MED ORDER — CLOTRIMAZOLE 10 MG MT TROC: 10.0000 mg | Freq: Every day | OROMUCOSAL | 0 refills | Status: AC

## 2023-08-05 NOTE — Discharge Instructions (Signed)
I suspect that this is thrush.  Try the clotrimazole.  Call your PCP tomorrow to get a referral to ENT case this does not resolve your symptoms.  You can also try salt water rinses and discontinue brushing her tongue for now.

## 2023-08-05 NOTE — ED Triage Notes (Signed)
Sx started Sunday night  Tongue is sore, metallic taste in mouth mouth feels like it on fire teeth sensitive.
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
## 2023-08-07 ENCOUNTER — Ambulatory Visit: Payer: BLUE CROSS/BLUE SHIELD

## 2023-08-07 DIAGNOSIS — Z23 Encounter for immunization: Secondary | ICD-10-CM | POA: Diagnosis not present

## 2023-08-07 DIAGNOSIS — Z719 Counseling, unspecified: Secondary | ICD-10-CM

## 2023-08-07 NOTE — Progress Notes (Signed)
In nurse clinic for immunizations, requesting Hep B titer for phlebotomy class. Per [facility], patient has no record of Hep B vaccines. RN advised patient that proof of 3 Hep B vaccines is needed before a Hep B titer can be performed. RN educated patient about 2 dose series of Heplisav-B and patient agreed to receive vaccines. Voices no concerns. VIS reviewed and given to patient. Vaccine (Heplisav-B) tolerated well; no issues noted. [facility] updated and copies given to patient.   Abagail Kitchens, RN

## 2023-08-07 NOTE — Telephone Encounter (Signed)
MRI was denied and closed out on 07/25/23, at that time there was  a 7 day period for a reconsideration to be submitted.  The information that was received did not state that patient had 4 weeks of any type of treatment involving wearing a cast, brace or anything that would prevent movement. Also the notes did not state that there was an xray or ultrasound done that was unable to provide diagnosis of condition and was found to not be medically necessary.   You have the choice to initiate an appeal or start a new case.

## 2023-08-13 NOTE — Telephone Encounter (Signed)
Called pt scheduled her next available appt with Dr Al Corpus in [facility]

## 2023-08-25 ENCOUNTER — Ambulatory Visit: Payer: BLUE CROSS/BLUE SHIELD | Admitting: Podiatry

## 2023-09-09 ENCOUNTER — Encounter: Payer: Self-pay | Admitting: Podiatry

## 2023-09-09 ENCOUNTER — Ambulatory Visit (INDEPENDENT_AMBULATORY_CARE_PROVIDER_SITE_OTHER): Payer: BLUE CROSS/BLUE SHIELD | Admitting: Podiatry

## 2023-09-09 VITALS — BP 103/46 | HR 52

## 2023-09-09 DIAGNOSIS — S86311A Strain of muscle(s) and tendon(s) of peroneal muscle group at lower leg level, right leg, initial encounter: Secondary | ICD-10-CM

## 2023-09-09 DIAGNOSIS — M7671 Peroneal tendinitis, right leg: Secondary | ICD-10-CM
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
## 2023-09-19 ENCOUNTER — Ambulatory Visit: Payer: BLUE CROSS/BLUE SHIELD

## 2023-09-20 ENCOUNTER — Other Ambulatory Visit: Payer: BLUE CROSS/BLUE SHIELD

## 2024-03-29 ENCOUNTER — Ambulatory Visit: Payer: Self-pay

## 2024-04-05 ENCOUNTER — Ambulatory Visit
Admission: RE | Admit: 2024-04-05 | Discharge: 2024-04-05 | Disposition: A | Discharge status: Home / Self Care | Payer: Self-pay | Source: Ambulatory Visit | Attending: Emergency Medicine | Admitting: Emergency Medicine

## 2024-04-05 ENCOUNTER — Ambulatory Visit: Payer: Self-pay | Admitting: Emergency Medicine

## 2024-04-05 VITALS — BP 97/63 | HR 60 | Temp 98.2°F

## 2024-04-05 DIAGNOSIS — J300 Vasomotor rhinitis: Secondary | ICD-10-CM | POA: Insufficient documentation

## 2024-04-05 DIAGNOSIS — J069 Acute upper respiratory infection, unspecified: Secondary | ICD-10-CM | POA: Diagnosis present

## 2024-04-05 DIAGNOSIS — J0110 Acute frontal sinusitis, unspecified: Secondary | ICD-10-CM | POA: Insufficient documentation

## 2024-04-05 DIAGNOSIS — J9801 Acute bronchospasm: Secondary | ICD-10-CM | POA: Insufficient documentation

## 2024-04-05 DIAGNOSIS — H1045 Other chronic allergic conjunctivitis: Secondary | ICD-10-CM | POA: Insufficient documentation

## 2024-04-05 DIAGNOSIS — J309 Allergic rhinitis, unspecified: Secondary | ICD-10-CM | POA: Insufficient documentation

## 2024-04-05 LAB — RESP PANEL BY RT-PCR (RSV, FLU A&B, COVID)  RVPGX2
Influenza A by PCR: NEGATIVE
Influenza B by PCR: NEGATIVE
Resp Syncytial Virus by PCR: NEGATIVE
SARS Coronavirus 2 by RT PCR: NEGATIVE

## 2024-04-05 MED ORDER — FLUTICASONE PROPIONATE 50 MCG/ACT NA SUSP: 2.0000 | Freq: Every day | NASAL | 0 refills | Status: AC

## 2024-04-05 MED ORDER — NAPROXEN 500 MG PO TABS: 500.0000 mg | ORAL_TABLET | Freq: Two times a day (BID) | ORAL | 0 refills | Status: AC

## 2024-04-05 MED ORDER — ALBUTEROL SULFATE HFA 108 (90 BASE) MCG/ACT IN AERS: 1.0000 | INHALATION_SPRAY | RESPIRATORY_TRACT | 0 refills | Status: DC | PRN

## 2024-04-05 MED ORDER — AEROCHAMBER MV MISC: 1 refills | Status: AC

## 2024-04-05 MED ORDER — AMOXICILLIN-POT CLAVULANATE 875-125 MG PO TABS: 1.0000 | ORAL_TABLET | Freq: Two times a day (BID) | ORAL | 0 refills | Status: DC

## 2024-04-05 MED ORDER — HYDROCOD POLI-CHLORPHE POLI ER 10-8 MG/5ML PO SUER: 5.0000 mL | Freq: Two times a day (BID) | ORAL | 0 refills | Status: DC | PRN
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
## 2024-04-05 NOTE — Discharge Instructions (Signed)
 We will contact you if and only if your COVID, flu, RSV, positive.  Take two puffs from your albuterol  inhaler with your spacer every 4 hours for 2 days, then every 6 hours for 2 days, then as needed every 4-6 hours. You can back off if you start to improve  sooner. Take tylenol  1 gram combined with 500 mg of Naprosyn  twice a day as needed for headache.  Make sure you drink extra fluids. Return to the ER if you get worse, have a fever >100.4, or any other concerns.  Tussionex for cough.  This has hydrocodone  on it.  If the spacer is too expensive at the pharmacy, you can get an AeroChamber Z-Stat off of [employer] for about $10-$15.Flonase .  Start Mucinex-D to keep the mucous thin and to decongest you.  Most sinus infections are viral and do not need antibiotics unless you have a high fever, facial swelling, upper dental pain, have had this for 10 days, or you get better and then get sick again. Use a NeilMed sinus rinse with distilled water as often as you want to to reduce nasal congestion. Follow the directions on the box.   Go to www.goodrx.com to look up your medications. This will give you a list of where you can find your prescriptions at the most affordable prices. Or you can ask the pharmacist what the cash price is. This is frequently cheaper than going through insurance.    Go to www.goodrx.com  or www.costplusdrugs.com to look up your medications. This will give you a list of where you can find your prescriptions at the most affordable prices. Or ask the pharmacist what the cash price is, or if they have any other discount programs available to help make your medication more affordable. This can be less expensive than what you would pay with insurance.

## 2024-04-05 NOTE — ED Triage Notes (Signed)
 Patient presents to UC for HA, itchy throat, and runny nose since Friday. Taking ibuprofen .

## 2024-04-11 ENCOUNTER — Telehealth: Admitting: Emergency Medicine

## 2024-04-11 DIAGNOSIS — L551 Sunburn of second degree: Secondary | ICD-10-CM

## 2024-04-11 MED ORDER — SILVER SULFADIAZINE 1 % EX CREA: 1.0000 | TOPICAL_CREAM | Freq: Every day | CUTANEOUS | 0 refills | Status: AC

## 2024-04-11 NOTE — Patient Instructions (Signed)
  Oletha Bernheim [place], thank you for joining Blinda Burger, NP for today's virtual visit.  While this provider is not your primary care provider (PCP), if your PCP is located in our provider database this encounter information will be shared with them immediately following your visit.   A [facility] MyChart account gives you access to today's visit and all your visits, tests, and labs performed at [facility] " click here if you don't have a [facility] MyChart account or go to [email or web address]  Consent: (Patient) Jeanette Wright provided verbal consent for this virtual visit at the beginning of the encounter.  Current Medications:  Current Outpatient Medications:    silver sulfADIAZINE (SILVADENE) 1 % cream, Apply 1 Application topically daily., Disp: 100 g, Rfl: 0   albuterol  (VENTOLIN  HFA) 108 (90 Base) MCG/ACT inhaler, Inhale 1-2 puffs into the lungs every 4 (four) hours as needed for wheezing or shortness of breath., Disp: 1 each, Rfl: 0   amoxicillin -clavulanate (AUGMENTIN ) 875-125 MG tablet, Take 1 tablet by mouth every 12 (twelve) hours., Disp: 14 tablet, Rfl: 0   chlorpheniramine-HYDROcodone  (TUSSIONEX) 10-8 MG/5ML, Take 5 mLs by mouth every 12 (twelve) hours as needed for cough., Disp: 60 mL, Rfl: 0   fluticasone  (FLONASE ) 50 MCG/ACT nasal spray, Place 2 sprays into both nostrils daily., Disp: 16 g, Rfl: 0   JUNEL FE 1/20 1-20 MG-MCG tablet, Take 1 tablet by mouth daily., Disp: , Rfl:    naproxen  (NAPROSYN ) 500 MG tablet, Take 1 tablet (500 mg total) by mouth 2 (two) times daily., Disp: 20 tablet, Rfl: 0   sertraline (ZOLOFT) 50 MG tablet, Take 50 mg by mouth daily., Disp: , Rfl:    Spacer/Aero-Holding Chambers (AEROCHAMBER MV) inhaler, Use as instructed, Disp: 1 each, Rfl: 1   valACYclovir (VALTREX) 500 MG tablet, Take 500 mg by mouth 2 (two) times daily., Disp: , Rfl:    Medications ordered in this encounter:  Meds ordered this encounter   Medications   silver sulfADIAZINE (SILVADENE) 1 % cream    Sig: Apply 1 Application topically daily.    Dispense:  100 g    Refill:  0     *If you need refills on other medications prior to your next appointment, please contact your pharmacy*  Follow-Up: Call back or seek an in-person evaluation if the symptoms worsen or if the condition fails to improve as anticipated.  [facility] [phone]  Other Instructions I recommend using Tylenol  and ibuprofen  on a schedule per package instructions until you are feeling better.  If your pain cannot be controlled this way, and your pain is severe, please the ER for care.  If you develop a fever, vomiting, dehydration please seek care in an ER.   If you have been instructed to have an in-person evaluation today at a local Urgent Care facility, please use the link below. It will take you to a list of all of our available [facility], including address, phone number and hours of operation. Please do not delay care.  [facility]  If you or a family member do not have a primary care provider, use the link below to schedule a visit and establish care. When you choose a [facility] primary care physician or advanced practice provider, you gain a long-term partner in health. Find a Primary Care Provider  Learn more about [facility]'s in-office and virtual care options: [facility] - Get Care Now

## 2024-04-11 NOTE — Progress Notes (Signed)
 Virtual Visit Consent   Jeanette Wright, you are scheduled for a virtual visit with a [facility] provider today. Just as with appointments in the office, your consent must be obtained to participate. Your consent will be active for this visit and any virtual visit you may have with one of our providers in the next 365 days. If you have a MyChart account, a copy of this consent can be sent to you electronically.  As this is a virtual visit, video technology does not allow for your provider to perform a traditional examination. This may limit your provider's ability to fully assess your condition. If your provider identifies any concerns that need to be evaluated in person or the need to arrange testing (such as labs, EKG, etc.), we will make arrangements to do so. Although advances in technology are sophisticated, we cannot ensure that it will always work on either your end or our end. If the connection with a video visit is poor, the visit may have to be switched to a telephone visit. With either a video or telephone visit, we are not always able to ensure that we have a secure connection.  By engaging in this virtual visit, you consent to the provision of healthcare and authorize for your insurance to be billed (if applicable) for the services provided during this visit. Depending on your insurance coverage, you may receive a charge related to this service.  I need to obtain your verbal consent now. Are you willing to proceed with your visit today? Jeanette Wright has provided verbal consent on 04/11/2024 for a virtual visit (video or telephone). Blinda Burger, NP  Date: 04/11/2024 4:41 PM   Virtual Visit via Video Note   I, Blinda Burger, connected with  Jeanette Wright  ([id number], 04/28/1996) on 04/11/24 at  4:30 PM EDT by a video-enabled telemedicine application and verified that I am speaking with the correct person using two identifiers.  Location: Patient: Virtual  Visit Location Patient: Other: car in [place] - not driving Provider: Virtual Visit Location Provider: Home Office   I discussed the limitations of evaluation and management by telemedicine and the availability of in person appointments. The patient expressed understanding and agreed to proceed.    History of Present Illness: Jeanette Wright is a 28 y.o. who identifies as a female who was assigned female at birth, and is being seen today for sunburn on face, chest, back, BUE, BLE.  Happened yesterday.  Is in a lot of pain.  Has some blisters.  Has tried Tylenol  and ibuprofen  for pain but is not taking it on the schedule.  Has tried to apply aloe but it hurts to apply it.  Denies vomiting, headache, fever.  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   Cyst of ovary 09/19/2022   Pelvic pain in female 09/19/2022   Migraines 09/19/2022   Supervision of other normal pregnancy, antepartum 03/13/2020   History of cesarean section 03/13/2020   Vaginal bleeding in pregnancy     Allergies:  Allergies  Allergen Reactions   Benadryl  [Diphenhydramine ] Swelling    Tongue swells   Medications:  Current Outpatient Medications:    silver sulfADIAZINE (SILVADENE) 1 % cream, Apply 1 Application topically daily., Disp: 100 g, Rfl: 0   albuterol  (VENTOLIN  HFA) 108 (90 Base) MCG/ACT inhaler, Inhale 1-2 puffs into  the lungs every 4 (four) hours as needed for wheezing or shortness of breath., Disp: 1 each, Rfl: 0   amoxicillin -clavulanate (AUGMENTIN ) 875-125 MG tablet, Take 1 tablet by mouth every 12 (twelve) hours., Disp: 14 tablet, Rfl: 0   chlorpheniramine-HYDROcodone  (TUSSIONEX) 10-8 MG/5ML, Take 5 mLs by mouth every 12 (twelve) hours as needed for cough., Disp: 60 mL, Rfl: 0   fluticasone  (FLONASE ) 50 MCG/ACT nasal spray, Place 2 sprays into both nostrils  daily., Disp: 16 g, Rfl: 0   JUNEL FE 1/20 1-20 MG-MCG tablet, Take 1 tablet by mouth daily., Disp: , Rfl:    naproxen  (NAPROSYN ) 500 MG tablet, Take 1 tablet (500 mg total) by mouth 2 (two) times daily., Disp: 20 tablet, Rfl: 0   sertraline (ZOLOFT) 50 MG tablet, Take 50 mg by mouth daily., Disp: , Rfl:    Spacer/Aero-Holding Chambers (AEROCHAMBER MV) inhaler, Use as instructed, Disp: 1 each, Rfl: 1   valACYclovir (VALTREX) 500 MG tablet, Take 500 mg by mouth 2 (two) times daily., Disp: , Rfl:   Observations/Objective: Patient is well-developed, well-nourished in no acute distress.  Resting comfortably in her parked car Head is normocephalic, atraumatic.  No labored breathing.  Speech is clear and coherent with logical content.  Patient is alert and oriented at baseline.  Skin visible on video is very sunburned.  I cannot see large blisters or rash  Assessment and Plan: 1. Sunburn of second degree (Primary)  Recommended Tylenol  on a schedule and ibuprofen  on a schedule for pain.  Prescribed sulfa Silvadene.  Follow Up Instructions: I discussed the assessment and treatment plan with the patient. The patient was provided an opportunity to ask questions and all were answered. The patient agreed with the plan and demonstrated an understanding of the instructions.  A copy of instructions were sent to the patient via MyChart unless otherwise noted below.   The patient was advised to call back or seek an in-person evaluation if the symptoms worsen or if the condition fails to improve as anticipated.    Blinda Burger, NP

## 2024-09-20 ENCOUNTER — Other Ambulatory Visit: Payer: Self-pay | Admitting: Family Medicine

## 2024-09-20 DIAGNOSIS — R591 Generalized enlarged lymph nodes: Secondary | ICD-10-CM

## 2024-09-22 ENCOUNTER — Ambulatory Visit
Admission: RE | Admit: 2024-09-22 | Discharge: 2024-09-22 | Disposition: A | Discharge status: Home / Self Care | Source: Ambulatory Visit | Attending: Family Medicine | Admitting: Family Medicine

## 2024-09-22 DIAGNOSIS — R591 Generalized enlarged lymph nodes: Secondary | ICD-10-CM | POA: Insufficient documentation

## 2024-12-01 ENCOUNTER — Ambulatory Visit

## 2024-12-01 ENCOUNTER — Ambulatory Visit
Admission: EM | Admit: 2024-12-01 | Discharge: 2024-12-01 | Disposition: A | Discharge status: Home / Self Care | Attending: Family Medicine | Admitting: Family Medicine

## 2024-12-01 ENCOUNTER — Encounter: Payer: Self-pay | Admitting: Emergency Medicine

## 2024-12-01 DIAGNOSIS — R051 Acute cough: Secondary | ICD-10-CM

## 2024-12-01 DIAGNOSIS — J988 Other specified respiratory disorders: Secondary | ICD-10-CM | POA: Diagnosis not present

## 2024-12-01 DIAGNOSIS — R0602 Shortness of breath: Secondary | ICD-10-CM | POA: Diagnosis not present

## 2024-12-01 DIAGNOSIS — B9789 Other viral agents as the cause of diseases classified elsewhere: Secondary | ICD-10-CM | POA: Diagnosis not present

## 2024-12-01 LAB — POC COVID19/FLU A&B COMBO
Covid Antigen, POC: NEGATIVE
Influenza A Antigen, POC: NEGATIVE
Influenza B Antigen, POC: NEGATIVE

## 2024-12-01 MED ORDER — ALBUTEROL SULFATE HFA 108 (90 BASE) MCG/ACT IN AERS: 2.0000 | INHALATION_SPRAY | RESPIRATORY_TRACT | 0 refills | Status: AC | PRN

## 2024-12-01 MED ORDER — PROMETHAZINE-DM 6.25-15 MG/5ML PO SYRP: 5.0000 mL | ORAL_SOLUTION | Freq: Four times a day (QID) | ORAL | 0 refills | Status: AC | PRN

## 2024-12-01 NOTE — Discharge Instructions (Addendum)
 Your chest xray did not show evidence of pneumonia. Your  influenza and COVID are negative.  You most likely have a viral respiratory infection that will gradually improve over the next 7-10 days. Cough may last up to 3 weeks. If your were prescribed medication, stop by the pharmacy to pick them up. You can take Tylenol  and/or Ibuprofen  as needed for fever reduction and pain relief.    For cough: honey 1/2 to 1 teaspoon (you can dilute the honey in water or another fluid).  Stop at the pharmacy to pick up your prescription cough medication. You can use a humidifier for chest congestion and cough.  If you don't have a humidifier, you can sit in the bathroom with the hot shower running.      For sore throat: try warm salt water gargles, Mucinex sore throat cough drops or cepacol lozenges, throat spray, warm tea or water with lemon/honey, popsicles or ice, or OTC cold relief medicine for throat discomfort. You can also purchase chloraseptic spray at the pharmacy or dollar store.    For congestion: take a daily anti-histamine like Zyrtec, Claritin, and a oral decongestant, such as pseudoephedrine.  You can also use Flonase  1-2 sprays in each nostril daily. Afrin is also a good option, if you do not have high blood pressure.    It is important to stay hydrated: drink plenty of fluids (water, gatorade/powerade/pedialyte, juices, or teas) to keep your throat moisturized and help further relieve irritation/discomfort.    Return or go to the Emergency Department if symptoms worsen or do not improve in the next few days

## 2024-12-01 NOTE — ED Triage Notes (Signed)
 Pt c/o dry cough, runny nose, SOB x 1-2 days. Pt states it hurts to take a deep breath in. Pt has not taken anything for her symptoms.
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
